# Patient Record
Sex: Male | Born: 1940 | Race: White | Hispanic: No | Marital: Married | Smoking: Former smoker
Health system: Southern US, Community
[De-identification: ages and names within clinical notes are randomized; demographics above are authoritative.]

## PROBLEM LIST (undated history)

## (undated) DIAGNOSIS — I493 Ventricular premature depolarization: Secondary | ICD-10-CM

## (undated) DIAGNOSIS — I8290 Acute embolism and thrombosis of unspecified vein: Secondary | ICD-10-CM

## (undated) DIAGNOSIS — I509 Heart failure, unspecified: Secondary | ICD-10-CM

## (undated) DIAGNOSIS — F419 Anxiety disorder, unspecified: Secondary | ICD-10-CM

## (undated) DIAGNOSIS — E785 Hyperlipidemia, unspecified: Secondary | ICD-10-CM

## (undated) HISTORY — DX: Hyperlipidemia, unspecified: E78.5

## (undated) HISTORY — DX: Heart failure, unspecified: I50.9

## (undated) HISTORY — DX: Anxiety disorder, unspecified: F41.9

## (undated) HISTORY — DX: Ventricular premature depolarization: I49.3

## (undated) HISTORY — PX: OTHER SURGICAL HISTORY: SHX169

## (undated) HISTORY — DX: Acute embolism and thrombosis of unspecified vein: I82.90

---

## 1999-11-13 ENCOUNTER — Emergency Department (HOSPITAL_COMMUNITY): Admission: EM | Admit: 1999-11-13 | Discharge: 1999-11-13 | Payer: Self-pay | Admitting: General Surgery

## 2005-07-22 ENCOUNTER — Inpatient Hospital Stay (HOSPITAL_COMMUNITY): Admission: EM | Admit: 2005-07-22 | Discharge: 2005-07-29 | Payer: Self-pay | Admitting: Emergency Medicine

## 2013-08-16 ENCOUNTER — Other Ambulatory Visit: Payer: Self-pay | Admitting: Interventional Cardiology

## 2013-08-16 DIAGNOSIS — Z79899 Other long term (current) drug therapy: Secondary | ICD-10-CM

## 2013-08-16 DIAGNOSIS — E785 Hyperlipidemia, unspecified: Secondary | ICD-10-CM

## 2013-09-15 ENCOUNTER — Encounter (INDEPENDENT_AMBULATORY_CARE_PROVIDER_SITE_OTHER): Payer: Self-pay

## 2013-09-15 ENCOUNTER — Ambulatory Visit (INDEPENDENT_AMBULATORY_CARE_PROVIDER_SITE_OTHER): Payer: Medicare Other | Admitting: Pharmacist

## 2013-09-15 ENCOUNTER — Other Ambulatory Visit (INDEPENDENT_AMBULATORY_CARE_PROVIDER_SITE_OTHER): Payer: Medicare Other

## 2013-09-15 DIAGNOSIS — Z79899 Other long term (current) drug therapy: Secondary | ICD-10-CM

## 2013-09-15 DIAGNOSIS — E785 Hyperlipidemia, unspecified: Secondary | ICD-10-CM

## 2013-09-15 DIAGNOSIS — I749 Embolism and thrombosis of unspecified artery: Secondary | ICD-10-CM

## 2013-09-15 LAB — LIPID PANEL
HDL: 49.6 mg/dL (ref >39.00)
Total CHOL/HDL Ratio: 3
VLDL: 11.2 mg/dL (ref 0.0–40.0)

## 2013-09-15 LAB — ALT: ALT: 19 U/L (ref 0–53)

## 2013-09-18 ENCOUNTER — Ambulatory Visit: Payer: Self-pay | Admitting: Interventional Cardiology

## 2013-09-18 ENCOUNTER — Telehealth: Payer: Self-pay | Admitting: Cardiology

## 2013-09-18 ENCOUNTER — Encounter: Payer: Self-pay | Admitting: Cardiology

## 2013-09-18 DIAGNOSIS — E785 Hyperlipidemia, unspecified: Secondary | ICD-10-CM

## 2013-09-18 NOTE — Telephone Encounter (Signed)
Message copied by Theda Sers on Thu Sep 18, 2013 11:21 AM ------      Message from: SMART, Gaspar Skeeters      Created: Wed Sep 17, 2013  9:54 AM       Excellent lipid panel on simvastatin 10 mg qd. LDL is well controlled.  No change in meds needed at this time.    Recheck lipid panel and hepatic panel in 6 months. ------

## 2013-09-23 ENCOUNTER — Encounter: Payer: Self-pay | Admitting: Interventional Cardiology

## 2013-09-23 ENCOUNTER — Encounter: Payer: Self-pay | Admitting: *Deleted

## 2013-09-23 DIAGNOSIS — F419 Anxiety disorder, unspecified: Secondary | ICD-10-CM | POA: Insufficient documentation

## 2013-09-23 DIAGNOSIS — I8290 Acute embolism and thrombosis of unspecified vein: Secondary | ICD-10-CM | POA: Insufficient documentation

## 2013-09-23 DIAGNOSIS — E785 Hyperlipidemia, unspecified: Secondary | ICD-10-CM | POA: Insufficient documentation

## 2013-09-23 DIAGNOSIS — I509 Heart failure, unspecified: Secondary | ICD-10-CM | POA: Insufficient documentation

## 2013-09-24 ENCOUNTER — Encounter: Payer: Self-pay | Admitting: Interventional Cardiology

## 2013-09-24 ENCOUNTER — Ambulatory Visit (INDEPENDENT_AMBULATORY_CARE_PROVIDER_SITE_OTHER): Payer: Medicare Other | Admitting: Interventional Cardiology

## 2013-09-24 VITALS — BP 88/52 | HR 76 | Ht 67.0 in | Wt 152.8 lb

## 2013-09-24 DIAGNOSIS — I420 Dilated cardiomyopathy: Secondary | ICD-10-CM | POA: Insufficient documentation

## 2013-09-24 DIAGNOSIS — I493 Ventricular premature depolarization: Secondary | ICD-10-CM

## 2013-09-24 DIAGNOSIS — I059 Rheumatic mitral valve disease, unspecified: Secondary | ICD-10-CM | POA: Insufficient documentation

## 2013-09-24 DIAGNOSIS — I4949 Other premature depolarization: Secondary | ICD-10-CM

## 2013-09-24 DIAGNOSIS — I428 Other cardiomyopathies: Secondary | ICD-10-CM

## 2013-09-24 DIAGNOSIS — R0602 Shortness of breath: Secondary | ICD-10-CM

## 2013-09-24 MED ORDER — LISINOPRIL 5 MG PO TABS: ORAL_TABLET | ORAL | Status: DC

## 2013-09-24 NOTE — Progress Notes (Signed)
Patient ID: Charles Shaw, male   DOB: [birth year], 72 y.o.   MRN: [id number]    [address] [place], [place]  [address] Phone: [phone] Fax:  [phone]  Date:  09/24/2013   ID:  Charles Shaw, DOB Jun 11, 1941, MRN [id number]  PCP:  No primary provider on file.      History of Present Illness: Charles Shaw is a 72 y.o. male who has a familial nonischemic cardiomyopathy. He has been on medical therapy. His lifestyle has improved quite a bit. He has stopped smoking and drinking. He has been on coumadin since 2006. No bleeding problems. COumadin is steady.  He walks regularly. He does yard wrok and remains active. He had been more [facility] in June 2014. he had trouble lying flat.  He had an elevated BNP.  He improved with increased Lasix.  He also has chronic nasal congestion.   Cardiomyopathy:  Denies : Chest pain.  Leg edema.  Orthopnea.  Paroxysmal nocturnal dyspnea.  Palpitations.  Syncope.    DOE with walking up stairs.    Wt Readings from Last 3 Encounters:  09/24/13 152 lb 12.8 oz (69.31 kg)     Past Medical History  Diagnosis Date  . Hyperlipidemia   . Anxiety   . Heart failure   . PVC (premature ventricular contraction)   . Thrombus     Current Outpatient Prescriptions  Medication Sig Dispense Refill  . carvedilol (COREG) 25 MG tablet Take 25 mg by mouth 2 (two) times daily with a meal.      . furosemide (LASIX) 40 MG tablet Take 40 mg by mouth 2 (two) times daily.      Marland Kitchen lisinopril (PRINIVIL,ZESTRIL) 5 MG tablet Take 5 mg by mouth daily.      . potassium chloride (KLOR-CON) 20 MEQ packet Take 20 mEq by mouth once.      . simvastatin (ZOCOR) 10 MG tablet Take 10 mg by mouth daily.      Marland Kitchen warfarin (COUMADIN) 5 MG tablet Take 5 mg by mouth daily. 1/2 TABLET Sunday,Tuesday, Wednesday,Thursday-----1/4 Tablet Monday and Friday       No current facility-administered medications for this visit.    Allergies:   No Known Allergies  Social History:  The  patient  reports that he has quit smoking. He does not have any smokeless tobacco history on file. He reports that he does not drink alcohol or use illicit drugs.   Family History:  The patient's family history includes Heart failure in his brother; Stroke in his mother.   ROS:  Please see the history of present illness.  No nausea, vomiting.  No fevers, chills.  No focal weakness.  No dysuria. Fluid overload better.  Taking extra furosemide occasionally, 1x/week.   All other systems reviewed and negative.   PHYSICAL EXAM: VS:  BP 88/52  Pulse 76  Ht 5\' 7"  (1.702 m)  Wt 152 lb 12.8 oz (69.31 kg)  BMI 23.93 kg/m2 Well nourished, well developed, in no acute distress HEENT: normal Neck: no JVD, no carotid bruits Cardiac:  normal S1, S2; RRR; premature beats Lungs:  clear to auscultation bilaterally, no wheezing, rhonchi or rales Abd: soft, nontender, no hepatomegaly Ext: no edema Skin: warm and dry Neuro:   no focal abnormalities noted     ASSESSMENT AND PLAN:  Treatment  1. SOB  LAB: BNP Notes: I think there is likely a component of allergies/respiratory issues. He has some nasal congestion and phlegm.  He does  have DOE with going stairs.   BNP was  A few months ago. Continue furosemide to 80 mg daily . Can take extra 40 mg based on weight and SHOB. . No lower extremity edema.    2. PVC  IMAGING: EKG    Harward,Amy 04/22/2013 01:55:13 PM > Yasiel Goyne,JAY 04/22/2013 02:01:16 PM > NSR, frequent PVCs.  22% PVCs on Holter in 2013.    Notes: PVCs noted in the past. This may account for the irregularity of his pulse. Certainly, if he has flipped into atrial fibrillation, this could account for some shortness of breath. Bigeminy today.     3. Chronic systolic heart failure  Notes: EF below 30% in the past.  Declined AICD in the past.  Follow weights at home.  Would recommend getting a scale.  Decrease lisinopril to 2.5 mg daily due to low BP.   4. Anticoagulant monitoring  Notes: INR to  be checked and Coumadin dose to be adjusted with Pharm.D.    5. Severe MR:  Worse in 2014 compared to 2011 which likely explains the fluid overload.  I do not think the MR is the cause of his cardiomyopathy.  I think the cardiomyopathy and lv dilatation is the cause of his MR.    Procedures  Venipuncture:  Venipuncture: Hall,Jechelle 04/22/2013 02:08:29 PM > , performed in right arm.        Labs    Lab: BNP  B-NATRIURETIC PEPTIDE in 6/14 1450 H 0-100 - pg/mL     Signed, Fredric Mare, MD, [facility] 09/24/2013 10:49 AM

## 2013-09-24 NOTE — Patient Instructions (Signed)
Your physician has recommended you make the following change in your medication:   1. Decrease Lisinopril to 2.5 mg daily.   2. Continue all other medications.   Your physician wants you to follow-up in: 6 months with Dr. Eldridge Dace. You will receive a reminder letter in the mail two months in advance. If you don't receive a letter, please call our office to schedule the follow-up appointment.

## 2013-10-27 ENCOUNTER — Ambulatory Visit (INDEPENDENT_AMBULATORY_CARE_PROVIDER_SITE_OTHER): Payer: Medicare Other | Admitting: Pharmacist

## 2013-10-27 ENCOUNTER — Encounter (INDEPENDENT_AMBULATORY_CARE_PROVIDER_SITE_OTHER): Payer: Self-pay

## 2013-10-27 DIAGNOSIS — I749 Embolism and thrombosis of unspecified artery: Secondary | ICD-10-CM

## 2013-11-10 ENCOUNTER — Other Ambulatory Visit: Payer: Self-pay

## 2013-11-10 ENCOUNTER — Other Ambulatory Visit: Payer: Self-pay | Admitting: Interventional Cardiology

## 2013-11-10 MED ORDER — LISINOPRIL 5 MG PO TABS: ORAL_TABLET | ORAL | Status: DC

## 2013-11-11 ENCOUNTER — Other Ambulatory Visit: Payer: Self-pay | Admitting: *Deleted

## 2013-11-11 ENCOUNTER — Other Ambulatory Visit: Payer: Self-pay | Admitting: Interventional Cardiology

## 2013-11-11 MED ORDER — LISINOPRIL 2.5 MG PO TABS: 2.5000 mg | ORAL_TABLET | Freq: Every day | ORAL | Status: DC

## 2013-11-29 ENCOUNTER — Telehealth: Payer: Self-pay | Admitting: Physician Assistant

## 2013-11-29 DIAGNOSIS — Z79899 Other long term (current) drug therapy: Secondary | ICD-10-CM

## 2013-11-29 NOTE — Telephone Encounter (Signed)
Patient's wife called the answering service. He has developed increased LE edema. She reports he developed a "cold" about 1 week ago. This is described as congestion and cough, no fevers or chills. He also has severe MR. He has inconsistently adhered to a Na/fluid restriction. He had chicken noodle soup the other day. She is unsure of his weight today. 152 lbs on follow-up w/ Dr. Eldridge Dace 09/2013. She notes increased orthopnea and DOE. He has increased his Lasix to 40mg  TID with minimal relief. Advised to increase to 80mg  BID for the weekend, increase KCl correspondingly, take on an empty stomach, elevate legs, continue to minimize salt/fluid intake. Monitor over weekend. If continues to worsen, advised to present to the nearest ED. Otherwise, will call the office to arrange an appointment for Monday.   Jacqulyn Bath, PA-C 11/29/2013 4:39 PM

## 2013-12-02 NOTE — Telephone Encounter (Signed)
How is [facility]

## 2013-12-03 NOTE — Telephone Encounter (Signed)
Spoke with pts wife and pt is feeling much better. Pt was taking furosemide with meals and he shouldn't have been doing that. He now takes lasix on an empty stomach and he feels much better. Pt is taking lasix 40mg  4 tablets daily. Pt is taking k-dur 20 meq 2 tabs daily. Is this enough? Pts wife also wants to know if pt needs magnesium? Pt still has a cough/cold, but it is better.

## 2013-12-03 NOTE — Telephone Encounter (Addendum)
Also, pt sleeps until noon everyday. Pt has a lot of fatigue and is grouchy at times.

## 2013-12-04 NOTE — Telephone Encounter (Signed)
To Dr. Varanasi, please advise.  

## 2013-12-04 NOTE — Addendum Note (Signed)
Addended byOrlene Plum H on: 12/04/2013 02:22 PM   Modules accepted: Orders

## 2013-12-04 NOTE — Telephone Encounter (Addendum)
Pts wife notified. Pt will have Bmet and Mag level checked on 12/08/13 as he will be here for coumadin. He started the increase in lasix om 1.10.15, so he will have been on below dosage for over a week by the time of lab test. Pt has a lot of fatigue, but has has a cold since October 14. I told pts wife this needs to be addressed by his pcp Dr. Sigmund Hazel asap as he should not have prolonged cold symptoms for over 3 months. Pt can not sleep more than an 1.5 hr at a time (takes a lot of naps throughout the day as well). He just cant sleep well. No witnessed apnea. Pt does snore almost every night and when he sleeps on his side the snoring is not as bad, but when on his back it is worse.

## 2013-12-04 NOTE — Telephone Encounter (Signed)
Can ask about sx of sleep apnea.  Can check BMet with Magnesium in one week.

## 2014-01-02 ENCOUNTER — Ambulatory Visit (INDEPENDENT_AMBULATORY_CARE_PROVIDER_SITE_OTHER): Payer: Medicare Other | Admitting: Pharmacist

## 2014-01-02 ENCOUNTER — Telehealth: Payer: Self-pay | Admitting: Cardiology

## 2014-01-02 DIAGNOSIS — I749 Embolism and thrombosis of unspecified artery: Secondary | ICD-10-CM

## 2014-01-02 LAB — POCT INR: INR: 4.3

## 2014-01-02 MED ORDER — WARFARIN SODIUM 2.5 MG PO TABS: ORAL_TABLET | ORAL | Status: DC

## 2014-01-02 NOTE — Telephone Encounter (Signed)
At last OV pts lisinopril was decreased to 2.5 mg. Pt states he feels the same (still tired) and would like to go back to 5 mg daily.

## 2014-01-05 MED ORDER — LISINOPRIL 5 MG PO TABS: ORAL_TABLET | ORAL | Status: DC

## 2014-01-05 NOTE — Telephone Encounter (Signed)
Pts wife notified. Pt has been back on 5 mg for quite sometime and he feels better on the 5 mg.

## 2014-01-05 NOTE — Telephone Encounter (Signed)
Ok to go back to 5 mg if BP is not dropping too low.

## 2014-01-05 NOTE — Addendum Note (Signed)
Addended byOrlene Plum H on: 01/05/2014 10:47 AM   Modules accepted: Orders

## 2014-01-16 ENCOUNTER — Ambulatory Visit (INDEPENDENT_AMBULATORY_CARE_PROVIDER_SITE_OTHER): Payer: Medicare Other | Admitting: *Deleted

## 2014-01-16 DIAGNOSIS — I749 Embolism and thrombosis of unspecified artery: Secondary | ICD-10-CM

## 2014-01-16 LAB — POCT INR: INR: 2.4

## 2014-01-22 ENCOUNTER — Other Ambulatory Visit: Payer: Self-pay | Admitting: Interventional Cardiology

## 2014-02-13 ENCOUNTER — Ambulatory Visit (INDEPENDENT_AMBULATORY_CARE_PROVIDER_SITE_OTHER): Payer: Medicare Other

## 2014-02-13 DIAGNOSIS — I749 Embolism and thrombosis of unspecified artery: Secondary | ICD-10-CM

## 2014-02-13 LAB — POCT INR: INR: 2.5

## 2014-02-14 ENCOUNTER — Other Ambulatory Visit: Payer: Self-pay | Admitting: Interventional Cardiology

## 2014-03-13 ENCOUNTER — Ambulatory Visit (INDEPENDENT_AMBULATORY_CARE_PROVIDER_SITE_OTHER): Payer: Medicare Other | Admitting: Pharmacist

## 2014-03-13 DIAGNOSIS — I749 Embolism and thrombosis of unspecified artery: Secondary | ICD-10-CM

## 2014-03-13 LAB — POCT INR: INR: 3.7

## 2014-03-19 ENCOUNTER — Encounter: Payer: Self-pay | Admitting: Interventional Cardiology

## 2014-03-19 ENCOUNTER — Ambulatory Visit (INDEPENDENT_AMBULATORY_CARE_PROVIDER_SITE_OTHER): Payer: Medicare Other

## 2014-03-19 ENCOUNTER — Ambulatory Visit (INDEPENDENT_AMBULATORY_CARE_PROVIDER_SITE_OTHER): Payer: Medicare Other | Admitting: Interventional Cardiology

## 2014-03-19 ENCOUNTER — Other Ambulatory Visit (INDEPENDENT_AMBULATORY_CARE_PROVIDER_SITE_OTHER): Payer: Medicare Other

## 2014-03-19 VITALS — BP 90/40 | HR 66 | Ht 67.0 in | Wt 142.0 lb

## 2014-03-19 DIAGNOSIS — I749 Embolism and thrombosis of unspecified artery: Secondary | ICD-10-CM

## 2014-03-19 DIAGNOSIS — I059 Rheumatic mitral valve disease, unspecified: Secondary | ICD-10-CM

## 2014-03-19 DIAGNOSIS — Z79899 Other long term (current) drug therapy: Secondary | ICD-10-CM

## 2014-03-19 DIAGNOSIS — E785 Hyperlipidemia, unspecified: Secondary | ICD-10-CM

## 2014-03-19 DIAGNOSIS — I428 Other cardiomyopathies: Secondary | ICD-10-CM

## 2014-03-19 LAB — LIPID PANEL
CHOLESTEROL: 140 mg/dL (ref 0–200)
HDL: 41.4 mg/dL (ref >39.00)
LDL Cholesterol: 86 mg/dL (ref 0–99)
TRIGLYCERIDES: 64 mg/dL (ref 0.0–149.0)
Total CHOL/HDL Ratio: 3
VLDL: 12.8 mg/dL (ref 0.0–40.0)

## 2014-03-19 LAB — BASIC METABOLIC PANEL
BUN: 20 mg/dL (ref 6–23)
CALCIUM: 9.3 mg/dL (ref 8.4–10.5)
CO2: 27 mEq/L (ref 19–32)
Chloride: 94 mEq/L — ABNORMAL LOW (ref 96–112)
Creatinine, Ser: 0.8 mg/dL (ref 0.4–1.5)
GFR: 95.87 mL/min (ref >60.00)
GLUCOSE: 88 mg/dL (ref 70–99)
Potassium: 4.8 mEq/L (ref 3.5–5.1)
Sodium: 130 mEq/L — ABNORMAL LOW (ref 135–145)

## 2014-03-19 LAB — HEPATIC FUNCTION PANEL
ALT: 33 U/L (ref 0–53)
AST: 31 U/L (ref 0–37)
Albumin: 3.6 g/dL (ref 3.5–5.2)
Alkaline Phosphatase: 175 U/L — ABNORMAL HIGH (ref 39–117)
Bilirubin, Direct: 1.1 mg/dL — ABNORMAL HIGH (ref 0.0–0.3)
TOTAL PROTEIN: 7.4 g/dL (ref 6.0–8.3)
Total Bilirubin: 3 mg/dL — ABNORMAL HIGH (ref 0.3–1.2)

## 2014-03-19 LAB — POCT INR: INR: 3.3

## 2014-03-19 NOTE — Patient Instructions (Signed)
Your physician recommends that you return for a FASTING lipid profile, hepatic and bmet.  Your physician recommends that you continue on your current medications as directed. Please refer to the Current Medication list given to you today.  Your physician wants you to follow-up in: 6 months with Dr. Eldridge Dace. You will receive a reminder letter in the mail two months in advance. If you don't receive a letter, please call our office to schedule the follow-up appointment.

## 2014-03-19 NOTE — Progress Notes (Signed)
Patient ID: Charles Shaw, male   DOB: May 07, 1941, 73 y.o.   MRN: [id number] Patient ID: Charles Shaw, male   DOB: May 07, 1941, [age] y.o.   MRN: [id number]    [address] [place], [place]  [address] Phone: [phone] Fax:  [phone]) [phone]  Date:  03/19/2014   ID:  Charles Shaw, DOB May 07, 1941, MRN [id number]  PCP:  Default, Provider, MD      History of Present Illness: Charles Shaw is a 73 y.o. male who has a familial nonischemic cardiomyopathy. He has been on medical therapy. His lifestyle has improved quite a bit. He has stopped smoking and drinking. He has been on coumadin since 2006. No bleeding problems. COumadin is steady.  He is not walking regularly. He does yard wrok and remains active. [facility]. he had trouble lying flat.  He had an elevated BNP.  He improved with increased Lasix.  He has more effect from the diuretic when taken on an empty stomach.   He had some swelling and called in on a weekend a few months ago, he had relief with additional diuretics.     Cardiomyopathy:  Denies : Chest pain.  Leg edema.  Orthopnea.  Paroxysmal nocturnal dyspnea.  Palpitations.  Syncope.    DOE with walking up stairs.    Wt Readings from Last 3 Encounters:  03/19/14 142 lb (64.411 kg)  09/24/13 152 lb 12.8 oz (69.31 kg)     Past Medical History  Diagnosis Date  . Hyperlipidemia   . Anxiety   . Heart failure   . PVC (premature ventricular contraction)   . Thrombus     Current Outpatient Prescriptions  Medication Sig Dispense Refill  . carvedilol (COREG) 25 MG tablet TAKE 1 TABLET WITH FOOD TWICE A DAY ORALLY (NEW RX AS OF 12/01/11)  180 tablet  2  . furosemide (LASIX) 40 MG tablet TAKE ONE TABLET BY MOUTH TWICE DAILY - MAY TAKE AN EXTRA TABLET DAILYIF NEEDED AS DIRECTED  200 tablet  0  . lisinopril (PRINIVIL,ZESTRIL) 5 MG tablet 1 tablet po Daily.  90 tablet  1  . potassium chloride (KLOR-CON) 20 MEQ packet Take 20 mEq by mouth once.      .  simvastatin (ZOCOR) 10 MG tablet TAKE ONE TABLET BY MOUTH DAILY IN THE EVENING  90 tablet  0  . warfarin (COUMADIN) 2.5 MG tablet Take 1 tablet as directed by coumadin clinic  30 tablet  3   No current facility-administered medications for this visit.    Allergies:   No Known Allergies  Social History:  The patient  reports that he has quit smoking. He does not have any smokeless tobacco history on file. He reports that he does not drink alcohol or use illicit drugs.   Family History:  The patient's family history includes Heart failure in his brother; Stroke in his mother.   ROS:  Please see the history of present illness.  No nausea, vomiting.  No fevers, chills.  No focal weakness.  No dysuria. Fluid overload better.  Taking extra furosemide occasionally, 1x/week.   All other systems reviewed and negative.   PHYSICAL EXAM: VS:  BP 90/40  Pulse 66  Ht 5\' 7"  (1.702 m)  Wt 142 lb (64.411 kg)  BMI 22.24 kg/m2 Well nourished, well developed, in no acute distress; appears euvolemic HEENT: normal Neck: no JVD, no carotid bruits Cardiac:  normal S1, S2; RRR; premature beats Lungs:  clear to  auscultation bilaterally, no wheezing, rhonchi or rales Abd: soft, nontender, no hepatomegaly Ext: no edema Skin: warm and dry Neuro:   no focal abnormalities noted     ASSESSMENT AND PLAN:  Treatment  1. SOB  LAB: BNP Notes: I think there is likely a component of allergies/respiratory issues. He has some nasal congestion and phlegm.  He does have DOE with going stairs.   BNP was elevated  A few months ago. Continue furosemide to 60 mg daily . Can take extra 40 mg based on weight and SHOB. . No lower extremity edema.  Sx are much better at this time.    2. PVC  IMAGING: EKG    Harward,Amy 04/22/2013 01:55:13 PM > Luc Shammas,JAY 04/22/2013 02:01:16 PM > NSR, frequent PVCs.  22% PVCs on Holter in 2013.    Notes: PVCs noted in the past. This may account for the irregularity of his pulse.  Certainly, if he has flipped into atrial fibrillation, this could account for some shortness of breath. Bigeminy today.     3. Chronic systolic heart failure  Notes: EF below 30% in the past.  Declined AICD in the past.  Follow weights at home.  Would recommend getting a scale.  Decrease lisinopril to 2.5 mg daily due to low BP.   4. Anticoagulant monitoring  Notes: INR to be checked and Coumadin dose to be adjusted with Pharm.D.    5. Severe MR:  Worse in 2014 compared to 2011 which likely explained the fluid overload in 2014.  I do not think the MR is the cause of his cardiomyopathy.  I think the cardiomyopathy and lv dilatation is the cause of his MR.   Still not interested with invasive management.  Manage with diuretics.   Lipids reviewed.  Well controlled.  COntinue simvastatin.          Lab: BNP  B-NATRIURETIC PEPTIDE in 6/14 1450 H 0-100 - pg/mL     Signed, Fredric Mare, MD, [facility] 03/19/2014 8:50 AM

## 2014-03-23 ENCOUNTER — Encounter: Payer: Self-pay | Admitting: Cardiology

## 2014-03-23 ENCOUNTER — Telehealth: Payer: Self-pay | Admitting: Cardiology

## 2014-03-23 DIAGNOSIS — Z79899 Other long term (current) drug therapy: Secondary | ICD-10-CM

## 2014-03-23 NOTE — Telephone Encounter (Signed)
Message copied by Alcario Drought on Mon Mar 23, 2014  2:30 PM ------      Message from: Jettie Booze      Created: Thu Mar 19, 2014  5:56 PM       Mild hyponatremia. Recheck be met in 2 weeks. OK to continue same dose of Lasix. ------

## 2014-03-23 NOTE — Telephone Encounter (Signed)
Pts wife notified. Copy mailed. Lab ordered.

## 2014-03-26 ENCOUNTER — Encounter: Payer: Self-pay | Admitting: Cardiology

## 2014-03-30 ENCOUNTER — Telehealth: Payer: Self-pay | Admitting: Interventional Cardiology

## 2014-03-30 DIAGNOSIS — R17 Unspecified jaundice: Secondary | ICD-10-CM

## 2014-03-30 DIAGNOSIS — R748 Abnormal levels of other serum enzymes: Secondary | ICD-10-CM

## 2014-03-30 DIAGNOSIS — Z79899 Other long term (current) drug therapy: Secondary | ICD-10-CM

## 2014-03-30 NOTE — Telephone Encounter (Signed)
New Message  Pt wife called states that a nurse called to have an appt scheduled with the gastrologist. Pt wife requests to have the liver panel retested because she doesn't see any yellow in the pt's skin or eyes. Pt wife states that he was a little under the weather during the last office visit and inquires if this is the reason why the test came back negative. Please call

## 2014-03-30 NOTE — Telephone Encounter (Signed)
Spoke with pts wife and pt wonders if his elevation in lever function could be from him from feeling well last week and from his pt/inr levels being off in the past. Pt feels his levels are up to much now and he thinks it may need to be decreased again. His next check 04/09/14. Pt wants to know if he should have another hepatic panel repeated before we refer him to GI or are the levels elevated enough where he needs to go ahead and be referred.

## 2014-03-31 NOTE — Telephone Encounter (Signed)
Pt will come for hepatic panel on 04/09/14 and then after we get results we can make final decision on referral. FYI to Dr. Eldridge Dace.

## 2014-03-31 NOTE — Telephone Encounter (Signed)
Okay to recheck hepatic panel.  Check hepatic panel sometime in next 1-2 weeks.  Thanks.

## 2014-04-02 ENCOUNTER — Other Ambulatory Visit: Payer: Medicare Other

## 2014-04-06 NOTE — Telephone Encounter (Signed)
Follow Up:  PT canceled labs for 5/21 but the pt would like to be referred to a GI doctor.

## 2014-04-07 ENCOUNTER — Encounter: Payer: Self-pay | Admitting: Internal Medicine

## 2014-04-07 NOTE — Telephone Encounter (Signed)
Pt aware that referral has been sent. [facility] will call to schedule.

## 2014-04-08 ENCOUNTER — Telehealth: Payer: Self-pay | Admitting: Interventional Cardiology

## 2014-04-08 NOTE — Telephone Encounter (Signed)
New message ° ° ° ° ° °Talk to a nurse----she would not tell me what she wanted °

## 2014-04-08 NOTE — Telephone Encounter (Signed)
Called pt.  States he only wants to speak with Amy.  Would not tell what the call was regarding.  Advised Amy was not in office today.  He just said for her to call when she was back in office.

## 2014-04-09 ENCOUNTER — Ambulatory Visit (INDEPENDENT_AMBULATORY_CARE_PROVIDER_SITE_OTHER): Payer: Medicare Other | Admitting: Pharmacist Clinician (PhC)/ Clinical Pharmacy Specialist

## 2014-04-09 ENCOUNTER — Other Ambulatory Visit: Payer: Medicare Other

## 2014-04-09 DIAGNOSIS — I749 Embolism and thrombosis of unspecified artery: Secondary | ICD-10-CM

## 2014-04-09 LAB — POCT INR: INR: 4.3

## 2014-04-09 NOTE — Telephone Encounter (Signed)
Called pt and spoke with his wife while he was beside her. He just wanted to see if we could get him seen sooner with the GI doctor. I will work on getting pts appt moved up.

## 2014-04-10 NOTE — Telephone Encounter (Signed)
Appt with GI moved up to 04/14/14 at 10am. Pts wife notified.

## 2014-04-14 ENCOUNTER — Ambulatory Visit (INDEPENDENT_AMBULATORY_CARE_PROVIDER_SITE_OTHER): Payer: Medicare Other | Admitting: Gastroenterology

## 2014-04-14 ENCOUNTER — Encounter: Payer: Self-pay | Admitting: Gastroenterology

## 2014-04-14 ENCOUNTER — Other Ambulatory Visit (INDEPENDENT_AMBULATORY_CARE_PROVIDER_SITE_OTHER): Payer: Medicare Other

## 2014-04-14 VITALS — BP 100/60 | HR 70 | Ht 67.0 in | Wt 147.0 lb

## 2014-04-14 DIAGNOSIS — R945 Abnormal results of liver function studies: Secondary | ICD-10-CM

## 2014-04-14 DIAGNOSIS — K4090 Unilateral inguinal hernia, without obstruction or gangrene, not specified as recurrent: Secondary | ICD-10-CM

## 2014-04-14 LAB — CBC WITH DIFFERENTIAL/PLATELET
BASOS PCT: 0.8 % (ref 0.0–3.0)
Basophils Absolute: 0 10*3/uL (ref 0.0–0.1)
EOS ABS: 0.3 10*3/uL (ref 0.0–0.7)
Eosinophils Relative: 5 % (ref 0.0–5.0)
HCT: 39.3 % (ref 39.0–52.0)
Hemoglobin: 13.1 g/dL (ref 13.0–17.0)
Lymphocytes Relative: 12.4 % (ref 12.0–46.0)
Lymphs Abs: 0.7 10*3/uL (ref 0.7–4.0)
MCHC: 33.3 g/dL (ref 30.0–36.0)
MCV: 95.5 fl (ref 78.0–100.0)
MONO ABS: 0.6 10*3/uL (ref 0.1–1.0)
Monocytes Relative: 10.2 % (ref 3.0–12.0)
NEUTROS PCT: 71.6 % (ref 43.0–77.0)
Neutro Abs: 4.3 10*3/uL (ref 1.4–7.7)
Platelets: 200 10*3/uL (ref 150.0–400.0)
RBC: 4.11 Mil/uL — AB (ref 4.22–5.81)
RDW: 16.7 % — AB (ref 11.5–15.5)
WBC: 6 10*3/uL (ref 4.0–10.5)

## 2014-04-14 LAB — BASIC METABOLIC PANEL
BUN: 22 mg/dL (ref 6–23)
CHLORIDE: 99 meq/L (ref 96–112)
CO2: 27 mEq/L (ref 19–32)
CREATININE: 1 mg/dL (ref 0.4–1.5)
Calcium: 9.3 mg/dL (ref 8.4–10.5)
GFR: 81.18 mL/min (ref >60.00)
Glucose, Bld: 86 mg/dL (ref 70–99)
POTASSIUM: 4.2 meq/L (ref 3.5–5.1)
SODIUM: 135 meq/L (ref 135–145)

## 2014-04-14 LAB — HEPATIC FUNCTION PANEL
ALK PHOS: 107 U/L (ref 39–117)
ALT: 26 U/L (ref 0–53)
AST: 26 U/L (ref 0–37)
Albumin: 3.7 g/dL (ref 3.5–5.2)
BILIRUBIN DIRECT: 0.8 mg/dL — AB (ref 0.0–0.3)
BILIRUBIN TOTAL: 2.4 mg/dL — AB (ref 0.2–1.2)
Total Protein: 6.8 g/dL (ref 6.0–8.3)

## 2014-04-14 LAB — TSH: TSH: 1.08 u[IU]/mL (ref 0.35–4.50)

## 2014-04-14 NOTE — Patient Instructions (Signed)
Your physician has requested that you go to the basement for the following lab work before leaving today: [facility]  You have been scheduled for a CT scan of the abdomen and pelvis at [place] [place] ([address]---this is in the same building as [facility]).   You are scheduled on 04/16/14 at 10:00am. You should arrive 15 minutes prior to your appointment time for registration. Please follow the written instructions below on the day of your exam:  WARNING: IF YOU ARE ALLERGIC TO IODINE/X-RAY DYE, PLEASE NOTIFY RADIOLOGY IMMEDIATELY AT [phone]! YOU WILL BE GIVEN A 13 HOUR PREMEDICATION PREP.  1) Do not eat or drink anything after 6:00am (4 hours prior to your test) 2) You have been given 2 bottles of oral contrast to drink. The solution may taste better if refrigerated, but do NOT add ice or any other liquid to this solution. Shake well before drinking.    Drink 1 bottle of contrast @ 8:00am (2 hours prior to your exam)  Drink 1 bottle of contrast @ 9:00am (1 hour prior to your exam)  You may take any medications as prescribed with a small amount of water except for the following: Metformin, Glucophage, Glucovance, Avandamet, Riomet, Fortamet, Actoplus Met, Janumet, Glumetza or Metaglip. The above medications must be held the day of the exam AND 48 hours after the exam.  The purpose of you drinking the oral contrast is to aid in the visualization of your intestinal tract. The contrast solution may cause some diarrhea. Before your exam is started, you will be given a small amount of fluid to drink. Depending on your individual set of symptoms, you may also receive an intravenous injection of x-ray contrast/dye. Plan on being at [facility] for 30 minutes or long, depending on the type of exam you are having performed.  This test typically takes 30-45 minutes to complete.  If you have any questions regarding your exam or if you need to reschedule, you may  call the CT department at [phone] between the hours of 8:00 am and 5:00 pm, Monday-Friday.  ________________________________________________________________________  Thank you for choosing me and [facility].  Pricilla Riffle. Dagoberto Ligas., MD., [facility]

## 2014-04-14 NOTE — Progress Notes (Signed)
    History of Present Illness: This is a 73 year old no accompanied by his wife. He is followed by Dr. Eldridge Dace for nonischemic cardiomyopathy with an ejection fraction below 30% in the past. He is maintained on warfarin anticoagulation. He does not have a regular primary care physician. Found to have an elevated total bilirubin at 3.0, direct bilirubin elevated at 1.1 and alkaline phosphatase elevated at 175. The rest of his LFTs were normal. He denies any personal or family history of liver disease. No new medications. He relates a slight change in taste and a slight decrease in appetite over the past few weeks. He has a chronic right inguinal hernia that he has treated with a truss. No prior colonoscopy or EGD. Denies weight loss, abdominal pain, constipation, diarrhea, change in stool caliber, melena, hematochezia, nausea, vomiting, dysphagia, reflux symptoms, chest pain.   Review of Systems: Pertinent positive and negative review of systems were noted in the above HPI section. All other review of systems were otherwise negative.  Current Medications, Allergies, Past Medical History, Past Surgical History, Family History and Social History were reviewed in [employer] record.  Physical Exam: General: Well developed , well nourished, no acute distress Head: Normocephalic and atraumatic Eyes:  sclerae anicteric, EOMI Ears: Normal auditory acuity Mouth: No deformity or lesions Neck: Supple, no masses or thyromegaly Lungs: Clear throughout to auscultation Heart: Regular rate and rhythm; no murmurs, rubs or bruits Abdomen: Soft, non tender and non distended. No masses noted. The liver edge is palpable 2 fingerbreadths below the right costal margin. Right inguinal hernia, reducible. Normal Bowel sounds Musculoskeletal: Symmetrical with no gross deformities  Skin: No lesions on visible extremities Pulses:  Normal pulses noted Extremities: No clubbing, cyanosis, edema or  deformities noted Neurological: Alert oriented x 4, grossly nonfocal Cervical Nodes:  No significant cervical adenopathy Inguinal Nodes: No significant inguinal adenopathy Psychological:  Alert and cooperative. Normal mood and affect  Assessment and Recommendations:  1. Elevated total bilirubin and alkaline phosphatase. Rule out hepatic congestion related to cardiomyopathy. Rule out biliary obstruction and intrinsic liver disease. Repeat LFTs and obtain CBC, chemistries and TSH today. Schedule abdominal/pelvic CT. Further evaluation pending these results.  2. Right inguinal hernia. Further evaluation with abdominal/ pelvic CT. Consider surgical referral.  3. Cardiomyopathy with EF below 30% the past.  4. Chronic warfarin anticoagulation.

## 2014-04-15 ENCOUNTER — Telehealth: Payer: Self-pay | Admitting: Gastroenterology

## 2014-04-15 NOTE — Telephone Encounter (Signed)
Patient asking that if LFTs were improved was it necessary to proceed with CT scan tomorrow. I have explained that he should keep the appt for CT as requested by Dr. Russella Dar

## 2014-04-16 ENCOUNTER — Other Ambulatory Visit: Payer: Self-pay

## 2014-04-16 ENCOUNTER — Ambulatory Visit (INDEPENDENT_AMBULATORY_CARE_PROVIDER_SITE_OTHER)
Admission: RE | Admit: 2014-04-16 | Discharge: 2014-04-16 | Disposition: A | Discharge status: Home / Self Care | Payer: Medicare Other | Source: Ambulatory Visit | Attending: Gastroenterology | Admitting: Gastroenterology

## 2014-04-16 DIAGNOSIS — K4090 Unilateral inguinal hernia, without obstruction or gangrene, not specified as recurrent: Secondary | ICD-10-CM

## 2014-04-16 DIAGNOSIS — R945 Abnormal results of liver function studies: Secondary | ICD-10-CM

## 2014-04-16 MED ORDER — IOHEXOL 300 MG/ML  SOLN
100.0000 mL | Freq: Once | INTRAMUSCULAR | Status: AC | PRN
  Administered 2014-04-16: 100 mL via INTRAVENOUS

## 2014-04-20 ENCOUNTER — Ambulatory Visit (INDEPENDENT_AMBULATORY_CARE_PROVIDER_SITE_OTHER): Payer: Medicare Other | Admitting: Pharmacist

## 2014-04-20 DIAGNOSIS — I749 Embolism and thrombosis of unspecified artery: Secondary | ICD-10-CM

## 2014-04-20 LAB — POCT INR: INR: 2.3

## 2014-04-21 ENCOUNTER — Other Ambulatory Visit: Payer: Self-pay | Admitting: Interventional Cardiology

## 2014-04-22 NOTE — Telephone Encounter (Signed)
Should this be one or two daily? Please advise. Thanks, MI 

## 2014-04-24 ENCOUNTER — Other Ambulatory Visit: Payer: Self-pay | Admitting: Interventional Cardiology

## 2014-05-08 ENCOUNTER — Other Ambulatory Visit: Payer: Self-pay | Admitting: Interventional Cardiology

## 2014-05-18 ENCOUNTER — Ambulatory Visit (INDEPENDENT_AMBULATORY_CARE_PROVIDER_SITE_OTHER): Payer: Medicare Other | Admitting: Pharmacist

## 2014-05-18 DIAGNOSIS — I749 Embolism and thrombosis of unspecified artery: Secondary | ICD-10-CM

## 2014-05-18 LAB — POCT INR: INR: 2.6

## 2014-06-08 ENCOUNTER — Ambulatory Visit: Payer: Medicare Other | Admitting: Internal Medicine

## 2014-06-15 ENCOUNTER — Other Ambulatory Visit: Payer: Self-pay | Admitting: Interventional Cardiology

## 2014-06-15 ENCOUNTER — Ambulatory Visit (INDEPENDENT_AMBULATORY_CARE_PROVIDER_SITE_OTHER): Payer: Medicare Other | Admitting: Pharmacist

## 2014-06-15 DIAGNOSIS — I749 Embolism and thrombosis of unspecified artery: Secondary | ICD-10-CM

## 2014-06-15 LAB — POCT INR: INR: 2.5

## 2014-06-15 MED ORDER — FUROSEMIDE 40 MG PO TABS: ORAL_TABLET | ORAL | Status: DC

## 2014-07-03 ENCOUNTER — Other Ambulatory Visit: Payer: Self-pay | Admitting: Interventional Cardiology

## 2014-07-21 ENCOUNTER — Other Ambulatory Visit: Payer: Self-pay | Admitting: Interventional Cardiology

## 2014-07-31 ENCOUNTER — Ambulatory Visit (INDEPENDENT_AMBULATORY_CARE_PROVIDER_SITE_OTHER): Payer: Medicare Other | Admitting: Pharmacist

## 2014-07-31 DIAGNOSIS — I749 Embolism and thrombosis of unspecified artery: Secondary | ICD-10-CM

## 2014-07-31 LAB — POCT INR: INR: 3.3

## 2014-08-28 ENCOUNTER — Ambulatory Visit (INDEPENDENT_AMBULATORY_CARE_PROVIDER_SITE_OTHER): Payer: Medicare Other | Admitting: *Deleted

## 2014-08-28 DIAGNOSIS — I749 Embolism and thrombosis of unspecified artery: Secondary | ICD-10-CM

## 2014-08-28 LAB — POCT INR: INR: 2

## 2014-09-04 ENCOUNTER — Other Ambulatory Visit: Payer: Self-pay | Admitting: Interventional Cardiology

## 2014-09-28 ENCOUNTER — Other Ambulatory Visit: Payer: Self-pay | Admitting: Interventional Cardiology

## 2014-10-05 ENCOUNTER — Other Ambulatory Visit: Payer: Self-pay | Admitting: Interventional Cardiology

## 2014-10-06 ENCOUNTER — Other Ambulatory Visit: Payer: Self-pay | Admitting: Interventional Cardiology

## 2014-10-07 ENCOUNTER — Ambulatory Visit (INDEPENDENT_AMBULATORY_CARE_PROVIDER_SITE_OTHER): Payer: Medicare Other | Admitting: Pharmacist

## 2014-10-07 DIAGNOSIS — I749 Embolism and thrombosis of unspecified artery: Secondary | ICD-10-CM

## 2014-10-07 LAB — POCT INR: INR: 2.6

## 2014-10-23 ENCOUNTER — Other Ambulatory Visit: Payer: Self-pay | Admitting: Interventional Cardiology

## 2014-10-24 NOTE — Telephone Encounter (Signed)
Rx was sent to pharmacy electronically. OV 11/30/2014

## 2014-11-23 ENCOUNTER — Ambulatory Visit (INDEPENDENT_AMBULATORY_CARE_PROVIDER_SITE_OTHER): Payer: Medicare Other

## 2014-11-23 DIAGNOSIS — I749 Embolism and thrombosis of unspecified artery: Secondary | ICD-10-CM

## 2014-11-23 LAB — POCT INR: INR: 4.1

## 2014-11-30 ENCOUNTER — Other Ambulatory Visit: Payer: Self-pay | Admitting: Interventional Cardiology

## 2014-11-30 ENCOUNTER — Ambulatory Visit (INDEPENDENT_AMBULATORY_CARE_PROVIDER_SITE_OTHER): Payer: Medicare Other | Admitting: Interventional Cardiology

## 2014-11-30 ENCOUNTER — Encounter: Payer: Self-pay | Admitting: Interventional Cardiology

## 2014-11-30 VITALS — BP 90/60 | HR 71 | Ht 67.0 in | Wt 150.0 lb

## 2014-11-30 DIAGNOSIS — I493 Ventricular premature depolarization: Secondary | ICD-10-CM

## 2014-11-30 DIAGNOSIS — E785 Hyperlipidemia, unspecified: Secondary | ICD-10-CM

## 2014-11-30 DIAGNOSIS — I059 Rheumatic mitral valve disease, unspecified: Secondary | ICD-10-CM

## 2014-11-30 DIAGNOSIS — I420 Dilated cardiomyopathy: Secondary | ICD-10-CM

## 2014-11-30 LAB — COMPREHENSIVE METABOLIC PANEL
ALBUMIN: 3.8 g/dL (ref 3.5–5.2)
ALT: 18 U/L (ref 0–53)
AST: 27 U/L (ref 0–37)
Alkaline Phosphatase: 177 U/L — ABNORMAL HIGH (ref 39–117)
BUN: 23 mg/dL (ref 6–23)
CO2: 26 meq/L (ref 19–32)
Calcium: 9.4 mg/dL (ref 8.4–10.5)
Chloride: 99 mEq/L (ref 96–112)
Creatinine, Ser: 1 mg/dL (ref 0.4–1.5)
GFR: 75.61 mL/min (ref >60.00)
Glucose, Bld: 77 mg/dL (ref 70–99)
Potassium: 5.4 mEq/L — ABNORMAL HIGH (ref 3.5–5.1)
SODIUM: 135 meq/L (ref 135–145)
Total Bilirubin: 3.3 mg/dL — ABNORMAL HIGH (ref 0.2–1.2)
Total Protein: 7.1 g/dL (ref 6.0–8.3)

## 2014-11-30 LAB — LIPID PANEL
Cholesterol: 109 mg/dL (ref 0–200)
HDL: 25.5 mg/dL — ABNORMAL LOW (ref >39.00)
LDL Cholesterol: 65 mg/dL (ref 0–99)
NonHDL: 83.5
Total CHOL/HDL Ratio: 4
Triglycerides: 91 mg/dL (ref 0.0–149.0)
VLDL: 18.2 mg/dL (ref 0.0–40.0)

## 2014-11-30 NOTE — Patient Instructions (Signed)
Your physician recommends that you continue on your current medications as directed. Please refer to the Current Medication list given to you today.  Your physician recommends that you return for lab work today (CMET and Lipid Profile)  Your physician wants you to follow-up in: 9 months with Dr. Eldridge Dace. You will receive a reminder letter in the mail two months in advance. If you don't receive a letter, please call our office to schedule the follow-up appointment.

## 2014-11-30 NOTE — Progress Notes (Signed)
Patient ID: Charles Shaw, male   DOB: June 10, 1941, 74 y.o.   MRN: [id number]     [address] [place], [place]  [address] Phone: [phone] Fax:  [phone]  Date:  11/30/2014   ID:  Charles Shaw, DOB [birth year], MRN [id number]  PCP:  Default, Provider, MD      History of Present Illness: Charles Shaw is a 74 y.o. male who has a familial nonischemic cardiomyopathy. He has been on medical therapy. His lifestyle has improved quite a bit. He has stopped smoking and drinking. He has been on coumadin since 2006. No bleeding problems. COumadin is steady.  He is not walking regularly. He does yard wrok and remains active. He had been more [facility] in June 2014. he had trouble lying flat.  He had an elevated BNP.  He improved with increased Lasix.  He has more effect from the diuretic when taken on an empty stomach.   He had some swelling and called in on a weekend a few months ago, he had relief with additional diuretics.     Cardiomyopathy:  Denies : Chest pain.  Leg edema.  Orthopnea.  Paroxysmal nocturnal dyspnea.  Palpitations.  Syncope.    DOE with walking up stairs improved.  He is under stress die to his wife's breast cancer.   This has interfered with his regular schedule and thinks it could be the cause of his irritability.   Follows up at Urgent Care.    Wt Readings from Last 3 Encounters:  11/30/14 150 lb (68.04 kg)  04/14/14 147 lb (66.679 kg)  03/19/14 142 lb (64.411 kg)     Past Medical History  Diagnosis Date  . Hyperlipidemia   . Anxiety   . Heart failure   . PVC (premature ventricular contraction)   . Thrombus     Current Outpatient Prescriptions  Medication Sig Dispense Refill  . carvedilol (COREG) 25 MG tablet TAKE 1 TABLET WITH FOOD TWICE A DAY ORALLY 180 tablet 1  . furosemide (LASIX) 40 MG tablet TAKE ONE TABLET BY MOUTH TWICE DAILY - MAY TAKE AN EXTRA TABLET DAILYIF NEEDED AS DIRECTED (Patient taking differently: PT TAKES 1 1/2 TABS Q HS) 200  tablet 1  . lisinopril (PRINIVIL,ZESTRIL) 5 MG tablet TAKE 1 TABLET BY MOUTH DAILY 90 tablet 0  . Potassium Chloride ER 20 MEQ TBCR TAKE 1 TABLET BY MOUTH TWICE DAILY 60 tablet 6  . simvastatin (ZOCOR) 10 MG tablet TAKE ONE TABLET BY MOUTH DAILY IN THE EVENING 90 tablet 0  . warfarin (COUMADIN) 2.5 MG tablet Take as directed by [facility] 30 tablet 2   No current facility-administered medications for this visit.    Allergies:   No Known Allergies  Social History:  The patient  reports that he has quit smoking. He has never used smokeless tobacco. He reports that he does not drink alcohol or use illicit drugs.   Family History:  The patient's family history includes Heart failure in his brother; Stomach cancer in his father; Stroke in his mother.   ROS:  Please see the history of present illness.  No nausea, vomiting.  No fevers, chills.  No focal weakness.  No dysuria. Fluid overload better.  Taking extra furosemide occasionally, 1x/week.  Feels that he is more irritable.  All other systems reviewed and negative.   PHYSICAL EXAM: VS:  BP 90/60 mmHg  Pulse 71  Ht  (1.702 m)  Wt 150 lb (68.04 kg)  BMI 23.49  kg/m2 Well nourished, well developed, in no acute distress; appears euvolemic HEENT: normal Neck: no JVD, no carotid bruits Cardiac:  normal S1, S2; RRR; premature beats , 3/6 systolic murmur Lungs:  clear to auscultation bilaterally, no wheezing, rhonchi or rales Abd: soft, nontender, no hepatomegaly Ext: no edema Skin: warm and dry Neuro:   no focal abnormalities noted Psych: normal afect     ASSESSMENT AND PLAN:  Treatment  1. SOB  Improved.   BNP was elevated  A few months ago. Continue furosemide to 60 mg daily- takes it on an empty stomach at 1AM . Can take extra 40 mg based on weight and SHOB, but if he takes this with food, there is no effect. No lower extremity edema.  Sx are much better at this time.    2. PVC       Notes: PVCs noted in the past.  This may account for the irregularity of his pulse. Certainly, if he has flipped into atrial fibrillation, this could account for some shortness of breath. Several PVCs today.     3. Chronic systolic heart failure  Notes: EF below 30% in the past.  Declined AICD in the past.  Likely the cause of severe MR. Follow weights at home.  Would recommend getting a scale.  Decrease lisinopril to 2.5 mg daily due to low BP.  He can predict when he will have fluid overload, typically when he eats something salty.   4. Anticoagulant monitoring  Notes: INR to be checked and Coumadin dose to be adjusted with Pharm.D.  Sometimes misses doses.     5. Severe MR:  Worse in 2014 compared to 2011 which likely explained the fluid overload in 2014.  I do not think the MR is the cause of his cardiomyopathy.  I think the cardiomyopathy and LV dilatation is the cause of his MR.   Still not interested with invasive management.  Manage with diuretics.   Lipids reviewed.  Well controlled at last check.  COntinue simvastatin.  Recheck lipids today.    Lab: BNP  B-NATRIURETIC PEPTIDE in 6/14 1450 H 0-100 - pg/mL  This has correlated with fluid overload in the past.    Signed, Fredric Mare, MD, [facility] 11/30/2014 12:05 PM

## 2014-12-02 ENCOUNTER — Telehealth: Payer: Self-pay | Admitting: Interventional Cardiology

## 2014-12-02 DIAGNOSIS — E875 Hyperkalemia: Secondary | ICD-10-CM

## 2014-12-02 NOTE — Telephone Encounter (Signed)
New problem ° ° °Pt returning your call concerning lab results.  °

## 2014-12-02 NOTE — Telephone Encounter (Signed)
Labs results given to pt and wife . Pt and wife are aware to decreased potassium to 20 mEq once daily and to repeat BMET in one to 2 weeks. Pt has an appointment for labs on 12/14/14 at 12:30 PM pt and wife are aware.

## 2014-12-02 NOTE — Telephone Encounter (Signed)
Pt's wife calling back, pls call [phone]

## 2014-12-02 NOTE — Telephone Encounter (Signed)
Left pt a message to call back. 

## 2014-12-03 ENCOUNTER — Other Ambulatory Visit: Payer: Self-pay | Admitting: *Deleted

## 2014-12-03 ENCOUNTER — Encounter: Payer: Self-pay | Admitting: *Deleted

## 2014-12-03 DIAGNOSIS — R7989 Other specified abnormal findings of blood chemistry: Secondary | ICD-10-CM

## 2014-12-03 DIAGNOSIS — R945 Abnormal results of liver function studies: Secondary | ICD-10-CM

## 2014-12-12 ENCOUNTER — Other Ambulatory Visit: Payer: Self-pay | Admitting: Interventional Cardiology

## 2014-12-14 ENCOUNTER — Ambulatory Visit (INDEPENDENT_AMBULATORY_CARE_PROVIDER_SITE_OTHER): Payer: Medicare Other | Admitting: *Deleted

## 2014-12-14 ENCOUNTER — Other Ambulatory Visit (INDEPENDENT_AMBULATORY_CARE_PROVIDER_SITE_OTHER): Payer: Medicare Other | Admitting: *Deleted

## 2014-12-14 DIAGNOSIS — E875 Hyperkalemia: Secondary | ICD-10-CM

## 2014-12-14 DIAGNOSIS — I749 Embolism and thrombosis of unspecified artery: Secondary | ICD-10-CM

## 2014-12-14 LAB — BASIC METABOLIC PANEL
BUN: 16 mg/dL (ref 6–23)
CHLORIDE: 99 meq/L (ref 96–112)
CO2: 31 mEq/L (ref 19–32)
Calcium: 9.1 mg/dL (ref 8.4–10.5)
Creatinine, Ser: 0.75 mg/dL (ref 0.40–1.50)
GFR: 109.03 mL/min (ref >60.00)
Glucose, Bld: 89 mg/dL (ref 70–99)
POTASSIUM: 4.1 meq/L (ref 3.5–5.1)
Sodium: 137 mEq/L (ref 135–145)

## 2014-12-14 LAB — POCT INR: INR: 4.4

## 2014-12-28 ENCOUNTER — Ambulatory Visit (INDEPENDENT_AMBULATORY_CARE_PROVIDER_SITE_OTHER): Payer: Medicare Other | Admitting: *Deleted

## 2014-12-28 DIAGNOSIS — I749 Embolism and thrombosis of unspecified artery: Secondary | ICD-10-CM

## 2014-12-28 LAB — POCT INR: INR: 2.6

## 2015-01-03 ENCOUNTER — Other Ambulatory Visit: Payer: Self-pay | Admitting: Interventional Cardiology

## 2015-01-04 NOTE — Telephone Encounter (Signed)
KEEP APPT SCHEDULED FOR MORE REFILLS

## 2015-01-07 ENCOUNTER — Telehealth: Payer: Self-pay | Admitting: Interventional Cardiology

## 2015-01-07 NOTE — Telephone Encounter (Signed)
Walk in pt form " Jury summons" dropped off gave to [employer].

## 2015-01-11 ENCOUNTER — Telehealth: Payer: Self-pay | Admitting: Interventional Cardiology

## 2015-01-11 DIAGNOSIS — I420 Dilated cardiomyopathy: Secondary | ICD-10-CM

## 2015-01-11 DIAGNOSIS — I059 Rheumatic mitral valve disease, unspecified: Secondary | ICD-10-CM

## 2015-01-11 NOTE — Telephone Encounter (Signed)
I spoke with the patient. He reports having increased intermittent ankle edema bilaterally over the last 1-2 weeks. He states his edema may go 8" above the ankles. He did eat popcorn last night, which he understands is salty, but he has had more frequent episodes of ankle edema prior to that. He reports taking his lasix about 1230 am because it has to be on an empty stomach for it to be effective for him. He is currently taking 40 mg two tablets once daily (at 12 am) and potassium 20 meq once daily. He reports that he will urinate about 7 x / night. Recently, he has only been urinating about 5 x/ night. The last two nights he has taken lasix 40 mg 2 & 1/2 tablets with no increased frequency in his urination. He reports non-pitting edema, but he has not weighed either. He states his weight is usually about 151 lbs, but he may be up about 5 lbs. He reports no increased SOB. I advised the patient I would review with Dr. Eldridge Dace since increasing his lasix may be a little more difficult if he can only take this at night/ early am. I will call him back with recommendations.

## 2015-01-11 NOTE — Telephone Encounter (Signed)
New Message     Pt c/o medication issue:  1. Name of Medication: Furosmide   2. How are you currently taking this medication (dosage and times per day)? 40mg  per tablet 2 during day and 1 if needed.  3. Are you having a reaction (difficulty breathing--STAT)? Swelling in ankle  4. What is your medication issue? Heart issue   Please give patient a call

## 2015-01-11 NOTE — Telephone Encounter (Signed)
Reviewed with Dr. Eldridge Dace. He recommends no medication changes at present, but would like to repeat the patient's echocardiogram since his last one was 04/2013 and his EF was 20-25 % at the time. I left a message for the patient to call back.

## 2015-01-12 NOTE — Telephone Encounter (Signed)
Patient is agreeable to having an ECHO since his last one was 04/2013. Patient verbalized understanding that there are no medication changes at this time also. ECHO ordered. Appointment information will be called to patient by scheduling department.

## 2015-01-12 NOTE — Telephone Encounter (Signed)
F/u    Pt returning call from nurse from yesterday. Please call pt.

## 2015-01-13 ENCOUNTER — Telehealth: Payer: Self-pay | Admitting: Interventional Cardiology

## 2015-01-13 DIAGNOSIS — I509 Heart failure, unspecified: Secondary | ICD-10-CM

## 2015-01-13 NOTE — Telephone Encounter (Signed)
New Msg          Pt wife Darl Pikes calling, states pt is having some fluid issues and she would like to spk with nurse.    Please return call at [phone].

## 2015-01-13 NOTE — Telephone Encounter (Signed)
Spoke with pt and his wife. Wife states that pt's ankles are still swollen after taking 80mg  of Lasix last night.  Wife states that pt's ankles are swollen above his shoes.  Wife states that this has been going on for about 2 weeks now.   Wife states that she believes that Lasix is no longer working for the pt. Pt and wife believe that he needs to be switched to a different medication. Pt's wife asked about a possible combination pill. Wife also states that pt's ankles look slightly better today. Suggested echo from previous phone call is scheduled for 4/25. Informed the wife and pt that I would send this information over to Dr. Eldridge Dace for review and advisement. Pt and wife verbalized understanding and were in agreement with this plan.

## 2015-01-14 MED ORDER — TORSEMIDE 20 MG PO TABS: 40.0000 mg | ORAL_TABLET | Freq: Every day | ORAL | Status: DC

## 2015-01-14 NOTE — Telephone Encounter (Signed)
Spoke with pharmacist, Audrie Lia, who states that Torsemide 40mg  daily is the equivalent to pt's dose. Spoke with pt's wife and informed her of medication change and for pt to come in for lab appt in one week. Verified pharmacy and informed wife that I would send prescription over to the pharmacy. Made appt for 01/20/15 for labs. Also informed wife that ultimately pt may need a referral to CHF clinic. Wife verbalized understanding and was in agreement with Dr. Hoyle Barr orders.

## 2015-01-14 NOTE — Telephone Encounter (Signed)
Left message to call back  

## 2015-01-14 NOTE — Telephone Encounter (Signed)
OK to try equivalent dose of torsemide instead of furosemide.  Please check with pharmacy to see what is comparable dose.  BMet in one week.  Ultimately, may need referral to CHF clinic.

## 2015-01-15 ENCOUNTER — Encounter: Payer: Self-pay | Admitting: *Deleted

## 2015-01-15 NOTE — Telephone Encounter (Signed)
I spoke with the patient and advised him that Dr. Eldridge Dace approved a letter for the patient to be dismissed from jury duty on 2/29. I advised him that I cannot get this to the court for him. I have given him the # to call the court the night before his scheduled appearance with his juror # as he left the original copy at the office. I have advised him I will put his original summons with a copy of the letter at the front desk in case he is not called off on Sunday night, he can pick this up on Monday morning. He voices understanding.

## 2015-01-15 NOTE — Telephone Encounter (Signed)
Walk in form was not given directly to me on 2/18. This form appeared in Dr. Hoyle Barr box on 2/25.

## 2015-01-20 ENCOUNTER — Ambulatory Visit (INDEPENDENT_AMBULATORY_CARE_PROVIDER_SITE_OTHER): Payer: Medicare Other | Admitting: *Deleted

## 2015-01-20 ENCOUNTER — Other Ambulatory Visit (INDEPENDENT_AMBULATORY_CARE_PROVIDER_SITE_OTHER): Payer: Medicare Other | Admitting: *Deleted

## 2015-01-20 DIAGNOSIS — I749 Embolism and thrombosis of unspecified artery: Secondary | ICD-10-CM

## 2015-01-20 DIAGNOSIS — I509 Heart failure, unspecified: Secondary | ICD-10-CM

## 2015-01-20 LAB — BASIC METABOLIC PANEL
BUN: 16 mg/dL (ref 6–23)
CHLORIDE: 97 meq/L (ref 96–112)
CO2: 34 mEq/L — ABNORMAL HIGH (ref 19–32)
Calcium: 9.1 mg/dL (ref 8.4–10.5)
Creatinine, Ser: 1 mg/dL (ref 0.40–1.50)
GFR: 78.21 mL/min (ref >60.00)
Glucose, Bld: 105 mg/dL — ABNORMAL HIGH (ref 70–99)
POTASSIUM: 3.5 meq/L (ref 3.5–5.1)
Sodium: 135 mEq/L (ref 135–145)

## 2015-01-20 LAB — POCT INR: INR: 2.7

## 2015-01-21 ENCOUNTER — Telehealth: Payer: Self-pay | Admitting: Interventional Cardiology

## 2015-01-21 NOTE — Telephone Encounter (Signed)
I called the patient's home # and spoke with his wife regarding his most recent lab results. She states he is still having quite a bit of swelling and wanted to know if the patient cold take more furosemide. Creatinine is 1.0 on most recent lab. The patient is currently taking torsemide 20 mg two tablets in the morning. I have advised her that the patient may increase torsemide to 20 mg two tablets in the morning and an additional one tablet at noon for 3 days, then resume current dose. I have encouraged her to have him weigh as he has not been doing this. I have also encouraged her to have the patient schedule his echo sooner than 4/25. She states they have company now, but she will relay the message to him.

## 2015-01-26 ENCOUNTER — Other Ambulatory Visit: Payer: Self-pay | Admitting: Interventional Cardiology

## 2015-02-02 ENCOUNTER — Telehealth: Payer: Self-pay | Admitting: Interventional Cardiology

## 2015-02-02 DIAGNOSIS — I420 Dilated cardiomyopathy: Secondary | ICD-10-CM

## 2015-02-02 MED ORDER — TORSEMIDE 20 MG PO TABS: ORAL_TABLET | ORAL | Status: DC

## 2015-02-02 NOTE — Telephone Encounter (Signed)
New Msg     Pt c/o medication issue:  1. Name of Medication: Furosemide   2. How are you currently taking this medication (dosage and times per day)? 2 1/2 tablets daily   3. Are you having a reaction (difficulty breathing--STAT)? No  4. What is your medication issue? Pt is still retaining fluid and is running out of pills. He's taking 2 1/2 a day when he feels a lot of fluid but this is making him run out  Pt needs refill. Can he get prescribed a larger quantity?   Please return call.

## 2015-02-02 NOTE — Telephone Encounter (Signed)
The pts wife states that the pt has continued to take Torsemide 20 mg TID since 01/21/15 and that he is running out of Torsemide.  Per Dr Eldridge Dace the pts wife is advised that the pt may continue to take Torsemide 20 mg TID and to have a BMET drawn soon. The pts wife verbalized understanding. [facility]semide [facility] to [facility] to [facility] per the pts wife request and a BMET has been ordered and scheduled to be drawn tomorrow, the pts wife states that the pt agrees with date and time of lab draw.

## 2015-02-03 ENCOUNTER — Other Ambulatory Visit: Payer: Medicare Other

## 2015-02-03 ENCOUNTER — Telehealth: Payer: Self-pay | Admitting: Interventional Cardiology

## 2015-02-03 MED ORDER — TORSEMIDE 20 MG PO TABS: ORAL_TABLET | ORAL | Status: DC

## 2015-02-03 NOTE — Telephone Encounter (Signed)
Called patient back. Order was put in yesterday to [facility]. Patient has changed pharmacy to [facility] on [address] in [place]. Torsemide 40 mg in AM by mouth and 20 mg in PM by mouth sent to patient's pharmacy of choice.

## 2015-02-03 NOTE — Telephone Encounter (Signed)
New Msg       Pt calling about a prescription for  Torsemide, would like to have it sent to    [facility] on [address] [place]. Telephone # [phone].   Pt request call once it has been sent to pharmacy.

## 2015-02-17 ENCOUNTER — Ambulatory Visit (INDEPENDENT_AMBULATORY_CARE_PROVIDER_SITE_OTHER): Payer: Medicare Other | Admitting: *Deleted

## 2015-02-17 DIAGNOSIS — I749 Embolism and thrombosis of unspecified artery: Secondary | ICD-10-CM | POA: Diagnosis not present

## 2015-02-17 LAB — POCT INR
INR: 4.3
INR: 4.3

## 2015-02-23 IMAGING — CT CT ABD-PELV W/ CM
2 of 5 series · 16 of 46 positions shown, 18 images · IV contrast (Omnipaque 300)
Comparison: None

CLINICAL DATA: Elevated liver function tests, decreased appetite,
history of right inguinal hernia

EXAM:
CT ABDOMEN AND PELVIS WITH CONTRAST
TECHNIQUE: Multidetector CT imaging of the abdomen and pelvis was performed
using the standard protocol following bolus administration of
intravenous contrast.
CONTRAST:  100mL OMNIPAQUE IOHEXOL 300 MG/ML  SOLN

[Series 2: abd/ pel 5mm · axial · 0.66mm/px · z∈[-454,-88]mm · 13 of 83 slices shown, 15 images]
[im 5/83  soft-tissue]
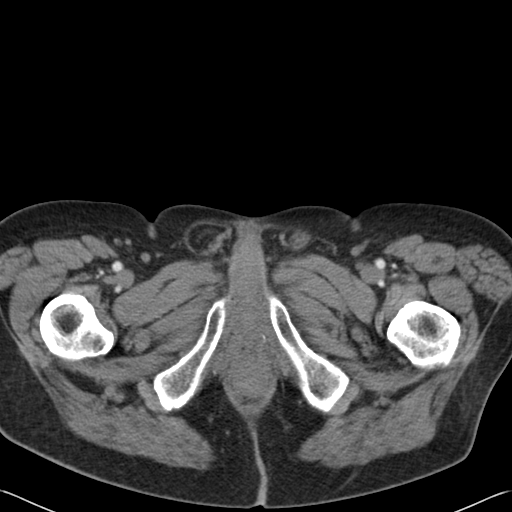
[im 5/83  bone]
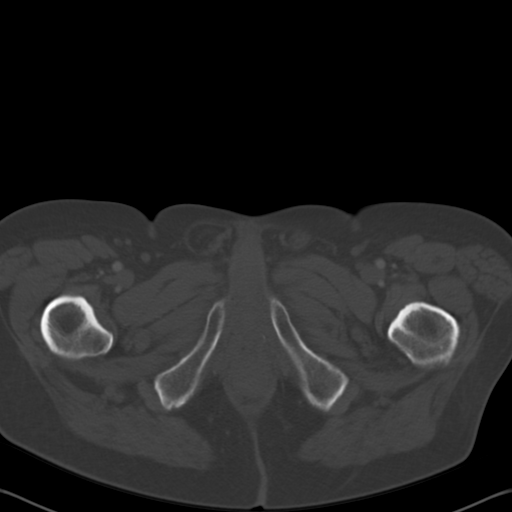
[im 13/83  soft-tissue]
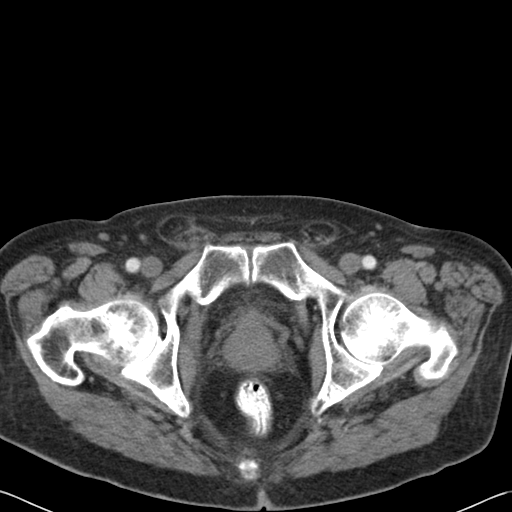
[im 17/83  soft-tissue]
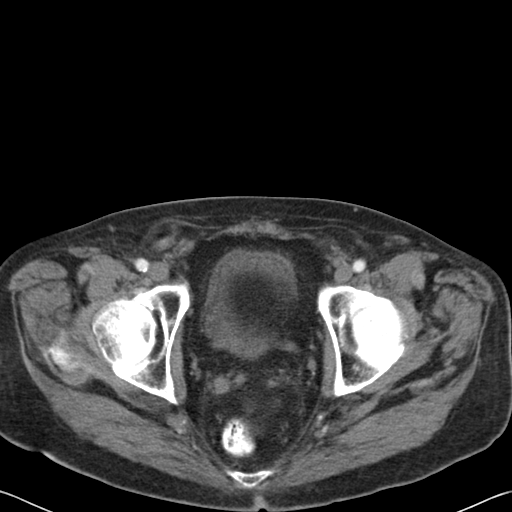
[im 25/83  soft-tissue]
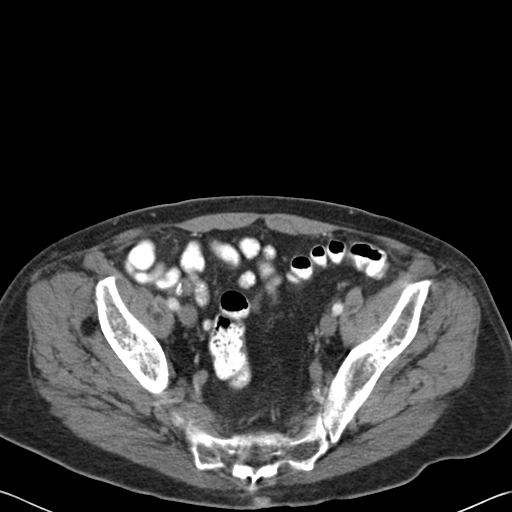
[im 29/83  soft-tissue]
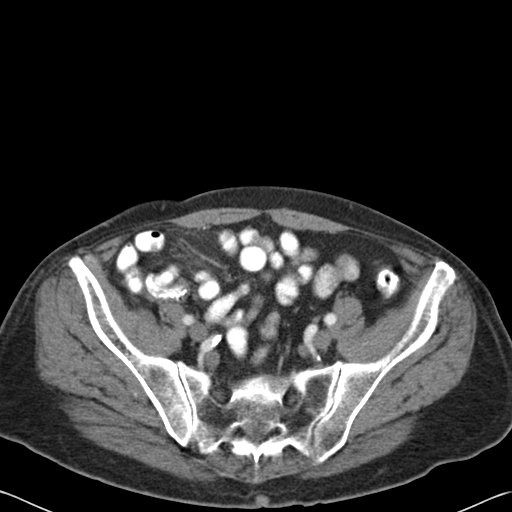
[im 37/83  soft-tissue]
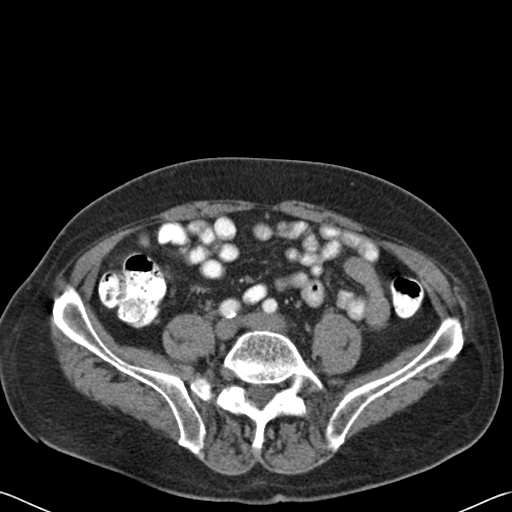
[im 42/83  soft-tissue]
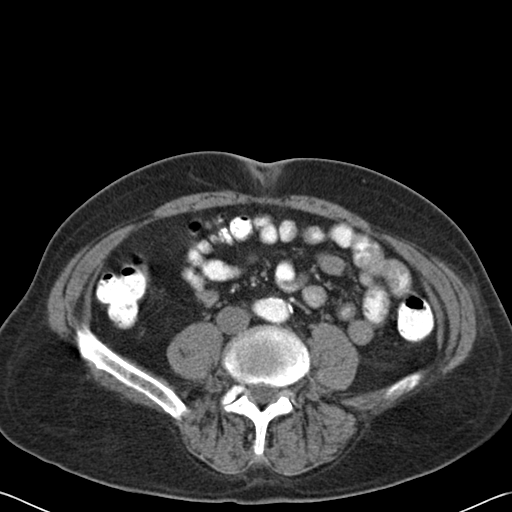
[im 46/83  soft-tissue]
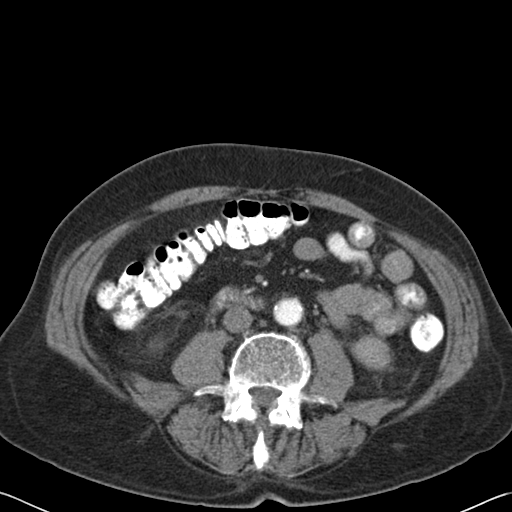
[im 54/83  soft-tissue]
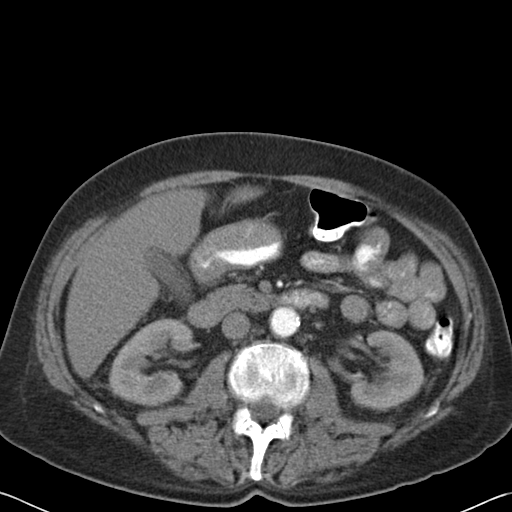
[im 54/83  bone]
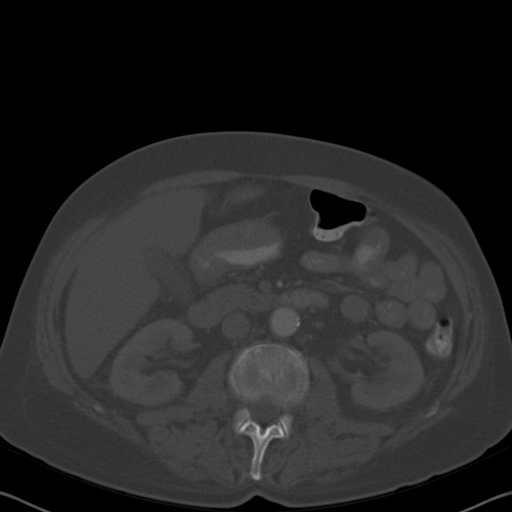
[im 58/83  soft-tissue]
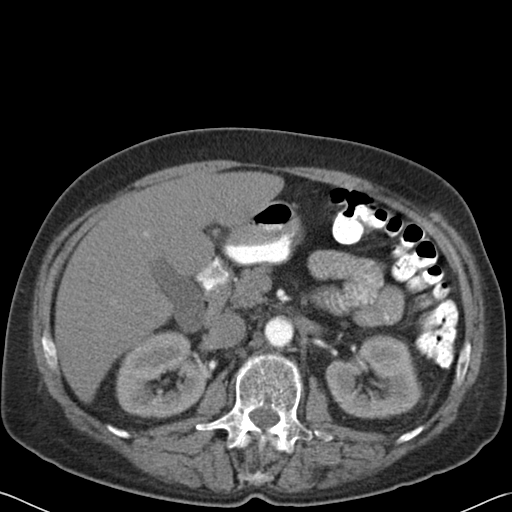
[im 66/83  soft-tissue]
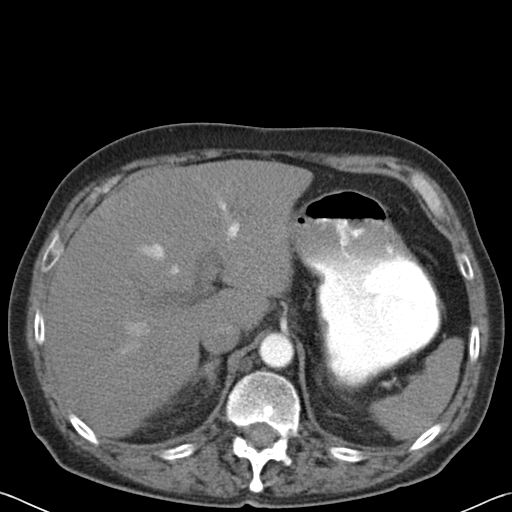
[im 70/83  soft-tissue]
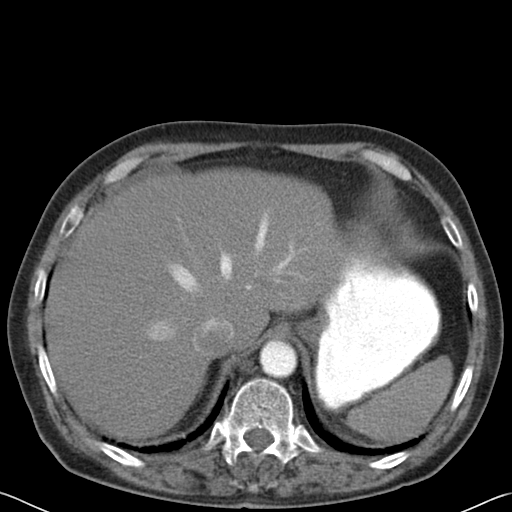
[im 78/83  soft-tissue]
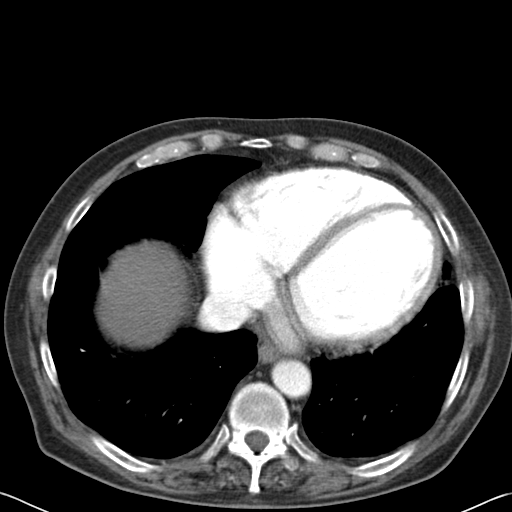

[Series 602: cor · coronal · 0.83mm/px · 3 of 116 slices shown]
[im 39/116  soft-tissue]
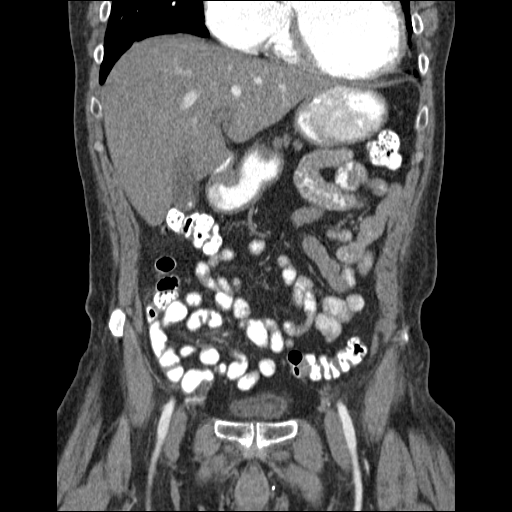
[im 52/116  soft-tissue]
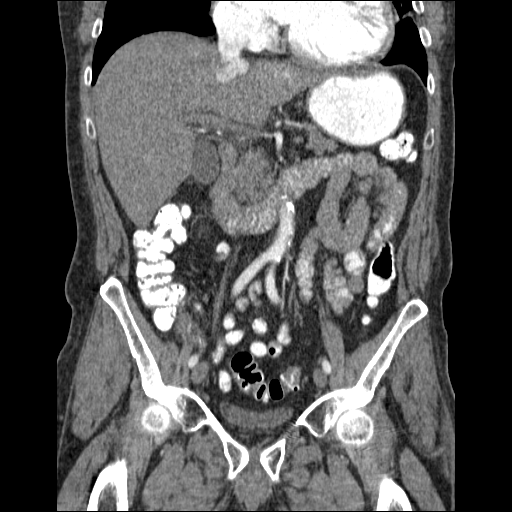
[im 64/116  soft-tissue]
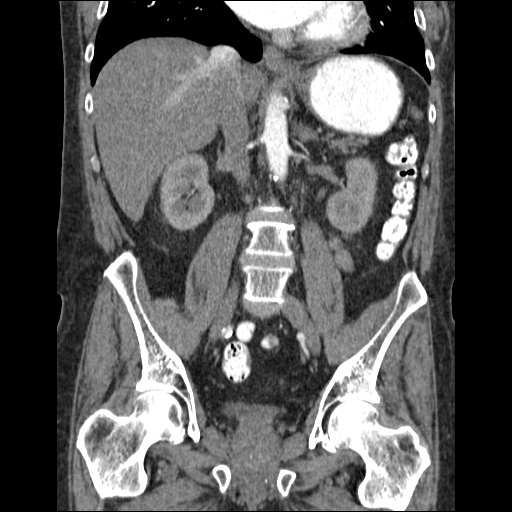

[16 of 46 positions shown; findings below may reference images not displayed]

FINDINGS: The lung bases are clear with mild interstitial prominence possibly
chronic posteriorly. There is cardiomegaly noted. Some reflux of
contrast is noted into the inferior vena cava and hepatic veins
suggesting a degree of right heart dysfunction. The liver enhances
and appears somewhat inhomogeneous particularly on the delayed
images which may indicate fatty infiltration. No focal hepatic
lesion is seen and no ductal dilatation is noted. There may be a
small faintly calcified gallstone present layering within the
gallbladder but the gallbladder wall is not thickened. The pancreas
appears normal in the pancreatic duct is not dilated. The adrenal
glands and spleen are unremarkable. The stomach is moderately fluid
distended with no abnormality noted. The kidneys enhance, and there
does appear to be a faintly calcified small right renal artery
aneurysm present of 7 mm in diameter. No definite renal calculi are
seen and on delayed images. The abdominal aorta is normal in
caliber. No adenopathy is seen with moderate atheromatous change
within the abdominal aorta.

The urinary bladder is completely decompressed and cannot be
evaluated. Fat enters both inguinal canals right slightly larger
than left with a small amount of small bowel entering the proximal
right inguinal canal. The prostate is normal in size. There is a
tiny amount of fluid in the pelvis which is somewhat unusual in a
male patient and may indicate gastroenteritis in the proper clinical
setting. The terminal ileum is unremarkable. The appendix is not
definitely seen. The lumbar vertebrae are in normal alignment.
IMPRESSION: 1. Inhomogeneous liver may indicate fatty infiltration. No focal
abnormality is seen.
2. Moderate cardiomegaly with reflux of contrast into the IVC and
hepatic veins which may indicate a degree of right heart
dysfunction.
3. Question small gallstone within the gallbladder.
4. 7 mm right renal artery aneurysm.
5. Fat enters both inguinal canals with a small amount of small
bowel entering the right inguinal canal proximally without
obstruction.
6. Small amount of fluid in the pelvis.  Question gastroenteritis.

## 2015-03-03 ENCOUNTER — Ambulatory Visit (INDEPENDENT_AMBULATORY_CARE_PROVIDER_SITE_OTHER): Payer: Medicare Other | Admitting: *Deleted

## 2015-03-03 DIAGNOSIS — I749 Embolism and thrombosis of unspecified artery: Secondary | ICD-10-CM | POA: Diagnosis not present

## 2015-03-03 LAB — POCT INR: INR: 3

## 2015-03-08 ENCOUNTER — Other Ambulatory Visit: Payer: Self-pay | Admitting: Interventional Cardiology

## 2015-03-15 ENCOUNTER — Other Ambulatory Visit (HOSPITAL_COMMUNITY): Payer: Medicare Other

## 2015-03-31 ENCOUNTER — Other Ambulatory Visit (INDEPENDENT_AMBULATORY_CARE_PROVIDER_SITE_OTHER): Payer: Medicare Other

## 2015-03-31 ENCOUNTER — Ambulatory Visit (INDEPENDENT_AMBULATORY_CARE_PROVIDER_SITE_OTHER): Payer: Medicare Other | Admitting: *Deleted

## 2015-03-31 DIAGNOSIS — I420 Dilated cardiomyopathy: Secondary | ICD-10-CM

## 2015-03-31 DIAGNOSIS — I749 Embolism and thrombosis of unspecified artery: Secondary | ICD-10-CM | POA: Diagnosis not present

## 2015-03-31 LAB — BASIC METABOLIC PANEL WITH GFR
BUN: 21 mg/dL (ref 6–23)
CO2: 31 meq/L (ref 19–32)
Calcium: 9 mg/dL (ref 8.4–10.5)
Chloride: 97 meq/L (ref 96–112)
Creatinine, Ser: 1.07 mg/dL (ref 0.40–1.50)
GFR: 72.29 mL/min
Glucose, Bld: 93 mg/dL (ref 70–99)
Potassium: 3.9 meq/L (ref 3.5–5.1)
Sodium: 134 meq/L — ABNORMAL LOW (ref 135–145)

## 2015-03-31 LAB — POCT INR: INR: 3.2

## 2015-04-14 ENCOUNTER — Ambulatory Visit (INDEPENDENT_AMBULATORY_CARE_PROVIDER_SITE_OTHER): Payer: Medicare Other

## 2015-04-14 DIAGNOSIS — I749 Embolism and thrombosis of unspecified artery: Secondary | ICD-10-CM

## 2015-04-14 LAB — POCT INR: INR: 3.3

## 2015-04-20 ENCOUNTER — Other Ambulatory Visit: Payer: Self-pay | Admitting: Interventional Cardiology

## 2015-05-07 ENCOUNTER — Ambulatory Visit (INDEPENDENT_AMBULATORY_CARE_PROVIDER_SITE_OTHER): Payer: Medicare Other | Admitting: *Deleted

## 2015-05-07 DIAGNOSIS — I749 Embolism and thrombosis of unspecified artery: Secondary | ICD-10-CM

## 2015-05-07 LAB — POCT INR: INR: 2.7

## 2015-05-31 ENCOUNTER — Other Ambulatory Visit: Payer: Medicare Other

## 2015-06-04 ENCOUNTER — Other Ambulatory Visit: Payer: Medicare Other

## 2015-06-04 ENCOUNTER — Ambulatory Visit (INDEPENDENT_AMBULATORY_CARE_PROVIDER_SITE_OTHER): Payer: Medicare Other

## 2015-06-04 DIAGNOSIS — I749 Embolism and thrombosis of unspecified artery: Secondary | ICD-10-CM | POA: Diagnosis not present

## 2015-06-04 LAB — POCT INR: INR: 2.4

## 2015-06-08 ENCOUNTER — Other Ambulatory Visit (INDEPENDENT_AMBULATORY_CARE_PROVIDER_SITE_OTHER): Payer: Medicare Other

## 2015-06-08 DIAGNOSIS — R945 Abnormal results of liver function studies: Secondary | ICD-10-CM

## 2015-06-08 DIAGNOSIS — R7989 Other specified abnormal findings of blood chemistry: Secondary | ICD-10-CM | POA: Diagnosis not present

## 2015-06-08 LAB — HEPATIC FUNCTION PANEL
ALBUMIN: 4 g/dL (ref 3.5–5.2)
ALT: 18 U/L (ref 0–53)
AST: 25 U/L (ref 0–37)
Alkaline Phosphatase: 178 U/L — ABNORMAL HIGH (ref 39–117)
BILIRUBIN TOTAL: 4.4 mg/dL — AB (ref 0.2–1.2)
Bilirubin, Direct: 2.1 mg/dL — ABNORMAL HIGH (ref 0.0–0.3)
Total Protein: 6.6 g/dL (ref 6.0–8.3)

## 2015-07-08 ENCOUNTER — Encounter: Payer: Self-pay | Admitting: Interventional Cardiology

## 2015-07-09 ENCOUNTER — Other Ambulatory Visit: Payer: Self-pay | Admitting: Adult Health

## 2015-07-16 ENCOUNTER — Ambulatory Visit (INDEPENDENT_AMBULATORY_CARE_PROVIDER_SITE_OTHER): Payer: Medicare Other | Admitting: Pharmacist

## 2015-07-16 DIAGNOSIS — I749 Embolism and thrombosis of unspecified artery: Secondary | ICD-10-CM

## 2015-07-16 LAB — POCT INR: INR: 2.1

## 2015-07-23 ENCOUNTER — Telehealth: Payer: Self-pay | Admitting: Interventional Cardiology

## 2015-07-23 MED ORDER — CARVEDILOL 12.5 MG PO TABS: 12.5000 mg | ORAL_TABLET | Freq: Two times a day (BID) | ORAL | Status: DC

## 2015-07-23 NOTE — Telephone Encounter (Signed)
Spoke with pt and he states that his BP has been running extremely low. Pt repeated BPs as written below in original message.   HR has been in the 80s consistently. Pt denies any swelling or CP. Pt does complain of dizziness, lightheadedness, occasional blurred vision and feeling like he is going to pass out when his BP is low. Pt states that SBP gets up into 90's when he is exerting and DBP gets down into the 30's occasionally. Wife states that pt eats and drinks well. Spoke with Dr. Eldridge Dace and he said to have pt cut his Coreg back to 12.5mg  BID. Spoke with pt's wife, DPR on file and informed her of this information. Wife verbalized understanding and was in agreement with this plan.

## 2015-07-23 NOTE — Telephone Encounter (Signed)
New message    Pt c/o BP issue: STAT if pt c/o blurred vision, one-sided weakness or slurred speech  1. What are your last 5 BP readings? 8-30 79/51; 8-31 75/43; 9-1 74/43; 9-2 83/44  2. Are you having any other symptoms (ex. Dizziness, headache, blurred vision, passed out)? Dizziness; some blurred vision   3. What is your BP issue? Running lower than normal  Pt thinks it could be medication  Pt states he takes furosemide

## 2015-07-28 ENCOUNTER — Ambulatory Visit: Payer: Medicare Other | Admitting: Gastroenterology

## 2015-08-02 ENCOUNTER — Other Ambulatory Visit: Payer: Self-pay | Admitting: Interventional Cardiology

## 2015-08-24 ENCOUNTER — Telehealth: Payer: Self-pay | Admitting: Interventional Cardiology

## 2015-08-24 NOTE — Telephone Encounter (Signed)
The pt is advised and he states that he has been taking Potassium 20 meq just once daily. He is advised to continue and he verbalized understanding.

## 2015-08-24 NOTE — Telephone Encounter (Signed)
LMTCB. According to the pts last CMET on 11/30/14 he should be taking Potassium 20 meq daily.

## 2015-08-24 NOTE — Telephone Encounter (Signed)
New Message  Pt wanted to verify new change to potassium meds- 1 tab or 2 tab per day. Please call back and discuss.

## 2015-08-27 ENCOUNTER — Ambulatory Visit (INDEPENDENT_AMBULATORY_CARE_PROVIDER_SITE_OTHER): Payer: Medicare Other | Admitting: *Deleted

## 2015-08-27 DIAGNOSIS — I749 Embolism and thrombosis of unspecified artery: Secondary | ICD-10-CM | POA: Diagnosis not present

## 2015-08-27 LAB — POCT INR: INR: 1.6

## 2015-09-03 ENCOUNTER — Ambulatory Visit (INDEPENDENT_AMBULATORY_CARE_PROVIDER_SITE_OTHER): Payer: Medicare Other | Admitting: Interventional Cardiology

## 2015-09-03 ENCOUNTER — Encounter: Payer: Self-pay | Admitting: Interventional Cardiology

## 2015-09-03 VITALS — BP 108/58 | HR 92 | Ht 67.0 in | Wt 146.0 lb

## 2015-09-03 DIAGNOSIS — I493 Ventricular premature depolarization: Secondary | ICD-10-CM

## 2015-09-03 DIAGNOSIS — I059 Rheumatic mitral valve disease, unspecified: Secondary | ICD-10-CM

## 2015-09-03 DIAGNOSIS — E785 Hyperlipidemia, unspecified: Secondary | ICD-10-CM

## 2015-09-03 DIAGNOSIS — I420 Dilated cardiomyopathy: Secondary | ICD-10-CM

## 2015-09-03 DIAGNOSIS — Z7901 Long term (current) use of anticoagulants: Secondary | ICD-10-CM | POA: Insufficient documentation

## 2015-09-03 NOTE — Patient Instructions (Signed)
Medication Instructions:  Same-no changes  Labwork: None  Testing/Procedures: None  Follow-Up: Your physician wants you to follow-up in: 6 months. You will receive a reminder letter in the mail two months in advance. If you don't receive a letter, please call our office to schedule the follow-up appointment.      

## 2015-09-03 NOTE — Progress Notes (Signed)
Patient ID: Charles Shaw, male   DOB: 17-Sep-1941, 74 y.o.   MRN: [id number]     Cardiology Office Note   Date:  09/03/2015   ID:  Charles Shaw, DOB [birth year], MRN [id number]  PCP:  No PCP Per Patient    No chief complaint on file.  follow-up CHF   Wt Readings from Last 3 Encounters:  09/03/15 146 lb (66.225 kg)  11/30/14 150 lb (68.04 kg)  04/14/14 147 lb (66.679 kg)       History of Present Illness: Charles Shaw is a 74 y.o. male  who has a familial nonischemic cardiomyopathy. He has been on medical therapy. His lifestyle has improved quite a bit. He has stopped smoking and drinking. He has been on coumadin since 2006 when he had a TIA. No bleeding problems. COumadin is steady.   He has had episodic fluid retention in the past. He improved with increased Lasix. Most recently, a month ago, he did not improve with increased Lasix. He was switched to torsemide. He lost a significant amount of fluid. He did not weigh himself during that time. He still takes the diuretic at night. He has more effect from the diuretic when taken on an empty stomach.  He had low blood pressure readings as well. His carvedilol was decreased.  Cardiomyopathy:  Denies : Chest pain.  Leg edema.  Orthopnea.  Paroxysmal nocturnal dyspnea.  Palpitations.  Syncope.    DOE with walking up stairs improved. He continues to not be interested in any invasive therapy. If he feels that things are getting worse, he will let us know.     Past Medical History  Diagnosis Date  . Hyperlipidemia   . Anxiety   . Heart failure (HCC)   . PVC (premature ventricular contraction)   . Thrombus     Past Surgical History  Procedure Laterality Date  . Spinal cyst       Current Outpatient Prescriptions  Medication Sig Dispense Refill  . carvedilol (COREG) 12.5 MG tablet Take 1 tablet (12.5 mg total) by mouth 2 (two) times daily. 180 tablet 3  . lisinopril (PRINIVIL,ZESTRIL) 5 MG tablet TAKE 1 TABLET BY  MOUTH DAILY 90 tablet 0  . potassium chloride SA (K-DUR,KLOR-CON) 20 MEQ tablet Take 20 mEq by mouth daily.    . simvastatin (ZOCOR) 10 MG tablet TAKE ONE TABLET BY MOUTH DAILY IN THE EVENING 90 tablet 0  . torsemide (DEMADEX) 20 MG tablet Take 40 mg in the AM by mouth and 20 mg in the PM by mouth 270 tablet 3  . warfarin (COUMADIN) 2.5 MG tablet TAKE AS DIRECTED BY COUMADIN CLINIC 20 tablet 3   No current facility-administered medications for this visit.    Allergies:   Review of patient's allergies indicates no known allergies.    Social History:  The patient  reports that he has quit smoking. He has never used smokeless tobacco. He reports that he does not drink alcohol or use illicit drugs.   Family History:  The patient's family history includes Heart failure in his brother; Stomach cancer in his father; Stroke in his mother.    ROS:  Please see the history of present illness.   Otherwise, review of systems are positive for recent fluid overload.   All other systems are reviewed and negative.    PHYSICAL EXAM: VS:  BP 108/58 mmHg  Pulse 92  Ht 5\' 7"  (1.702 m)  Wt 146 lb (66.225 kg)  BMI 22.86 kg/m2 , BMI Body  mass index is 22.86 kg/(m^2). GEN: Well nourished, well developed, in no acute distress HEENT: normal Neck: no JVD, carotid bruits, or masses Cardiac: RRR; 1/6 systolic murmur murmurs, rubs, or gallops,no edema , premature beats Respiratory:  clear to auscultation bilaterally, normal work of breathing GI: soft, nontender, nondistended, + BS MS: no deformity or atrophy Skin: warm and dry, no rash Neuro:  Strength and sensation are intact Psych: euthymic mood, full affect    Recent Labs: 03/31/2015: BUN 21; Creatinine, Ser 1.07; Potassium 3.9; Sodium 134* 06/08/2015: ALT 18   Lipid Panel    Component Value Date/Time   CHOL 109 11/30/2014 1237   TRIG 91.0 11/30/2014 1237   HDL 25.50* 11/30/2014 1237   CHOLHDL 4 11/30/2014 1237   VLDL 18.2 11/30/2014 1237    LDLCALC 65 11/30/2014 1237     Other studies Reviewed: Additional studies/ records that were reviewed today with results demonstrating: Echo from June 2014 showing severely reduced left ventricular function and severe mitral regurgitation..   ASSESSMENT AND PLAN:  1. Cardiomyopathy: His recent flare of heart failure has resolved. We again had a long discussion about the natural course of his illness. I explained him that his cardiac function will likely continue to get worse. There are more advanced therapies available such as biventricular pacing and defibrillator. He has declined AICD placement in the past. He continues to not be interested. He states that if he feels worse, he will let us know. I explained him that he may progress to the point where he may need inotropes or ventricular assist device. He states that he would be hesitant to follow any type of very invasive strategy. He would consider it at the time it was required. I did explain to him that some of these therapies may be better considered when he is healthier as opposed to when he gets too sick. He continues to not be interested in any further evaluation. We discussed repeat echocardiogram to see if something is change with his cardiac function given the recent heart failure flareup. He is not interested in even a repeat echocardiogram. 2. PVC: He continues to have PVCs. He is not bothered by them. 3. Severe mitral regurgitation: This likely functional due to left ventricular chamber dilatation. 4. Anticoagulant monitoring: Continue to target INR between 2 and 3. 5. Hyperlipidemia: Lipids controlled in January 2016.   Current medicines are reviewed at length with the patient today.  The patient concerns regarding his medicines were addressed.  The following changes have been made:  No change  Labs/ tests ordered today include:  No orders of the defined types were placed in this encounter.    Recommend 150 minutes/week of  aerobic exercise Low fat, low carb, high fiber diet recommended  Disposition:   FU in 6 months   Delorise Jackson., MD  09/03/2015 10:35 AM    [facility] [address] [place], [place], [place]  [address] Phone: [phone]; Fax: [phone]

## 2015-09-13 ENCOUNTER — Telehealth: Payer: Self-pay | Admitting: Interventional Cardiology

## 2015-09-13 NOTE — Telephone Encounter (Signed)
New message     Pt has gout.  His PCP want to presc colchicine. Is it ok with Dr Eldridge Dace to take this medication along with his heart meds?

## 2015-09-13 NOTE — Telephone Encounter (Signed)
Please check with Pharmacist.  If ok with them, then can give the patient the go ahead to start colchicine.

## 2015-09-13 NOTE — Telephone Encounter (Signed)
Per Meagan, pharmacist, It is ok for the pt to take Colchicine to treat his gout. Dr Eldridge Dace is aware.

## 2015-09-13 NOTE — Telephone Encounter (Signed)
Please advise 

## 2015-09-14 ENCOUNTER — Ambulatory Visit (INDEPENDENT_AMBULATORY_CARE_PROVIDER_SITE_OTHER): Payer: Medicare Other | Admitting: *Deleted

## 2015-09-14 DIAGNOSIS — I749 Embolism and thrombosis of unspecified artery: Secondary | ICD-10-CM

## 2015-09-14 LAB — POCT INR: INR: 2.2

## 2015-10-05 ENCOUNTER — Telehealth: Payer: Self-pay | Admitting: Interventional Cardiology

## 2015-10-05 NOTE — Telephone Encounter (Signed)
**Note De-Identified Naida Escalante Obfuscation** Per Dr Katrinka Blazing (DOD) the pt is advised to take 80 mg of Torsemide now and 80 mg tomorrow morning and to call the office tomorrow to let us know how he is. If the increased dose of Torsemide is not effective and the pt continues to have swelling pt needs to be added to flex schedule per Dr Katrinka Blazing. The pt repeated instructions back to me and verbalized understanding.

## 2015-10-05 NOTE — Telephone Encounter (Signed)
**Note De-Identified Aveah Castell Obfuscation** The pt states that he has had swelling in his legs for about a week and mild SOB since yesterday. He states that he does not have a scale at home so he is unaware of his weight but feels sure he has gained due to his swelling. The pt takes Torsemide 60 mg daily. He wants to know what to do.  He is advised that I will discuss with our DOD and call him back with recommendations. He verbalized understanding.

## 2015-10-05 NOTE — Telephone Encounter (Signed)
Pt c/o swelling: STAT is pt has developed SOB within 24 hours  1. How long have you been experiencing swelling? 1week  2. Where is the swelling located? Ankles and up legs, thighs  3.  Are you currently taking a "fluid pill"?yes  4.  Are you currently SOB? no  5.  Have you traveled recently?no

## 2015-10-06 NOTE — Telephone Encounter (Signed)
**Note De-Identified [place] Grove Obfuscation** The pt states that he took Torsemide 80 mg last night at 10:30 and 80 mg at 8:00 this morning as advised. He reports that he urinated 4 times throughout the night and that he has urinated 3 times since taking his dose this am. He states that he feels better with no SOB at this time and that his edema is improving. He states that he knows his body and that it will take a couple days for his legs to go back to normal for him. He reports that he is elevating his legs as much as he can and avoiding salt. The pt is advised to purchase a set of scales and to weight daily so we will have a better understanding how much fluid he is retaining.  He states that he will.  He is advised to call us back if his edema/SOB persist or worsens so we can schedule him to be seen as advised per Dr Katrinka Blazing.

## 2015-10-11 ENCOUNTER — Other Ambulatory Visit: Payer: Self-pay | Admitting: Interventional Cardiology

## 2015-10-25 ENCOUNTER — Ambulatory Visit (INDEPENDENT_AMBULATORY_CARE_PROVIDER_SITE_OTHER): Payer: Medicare Other | Admitting: Pharmacist

## 2015-10-25 ENCOUNTER — Telehealth: Payer: Self-pay | Admitting: Interventional Cardiology

## 2015-10-25 DIAGNOSIS — I749 Embolism and thrombosis of unspecified artery: Secondary | ICD-10-CM | POA: Diagnosis not present

## 2015-10-25 LAB — POCT INR: INR: 5.7

## 2015-10-25 NOTE — Telephone Encounter (Signed)
Pt c/o swelling: STAT is pt has developed SOB within 24 hours  1. How long have you been experiencing swelling?  He said for a while no date given he said you are aware  2. Where is the swelling located? Legs and ankles  3.  Are you currently taking a "fluid pill"? Yes  4.  Are you currently SOB? No  5.  Have you traveled recently?no

## 2015-10-25 NOTE — Telephone Encounter (Signed)
The pt states that he has swelling in his feet, legs and up in his thighs. He wants to know if he can have permission to take more Torsemide when he has increased swelling. He is advised that I am going to discuss with our DOD, Dr Delton See, and call him back with her recommendations.

## 2015-10-25 NOTE — Telephone Encounter (Signed)
The pt is advised per Dr Delton See, DOD, to take 40 mg of Torsemide BID X 2 days. He verbalized understanding and agrees with plan.  He is aware that I will speak with Dr Eldridge Dace tomorrow to see if he can give the pt new Torsemide instructions.

## 2015-10-27 MED ORDER — TORSEMIDE 20 MG PO TABS: ORAL_TABLET | ORAL | Status: DC

## 2015-10-27 NOTE — Telephone Encounter (Signed)
**Note De-Identified Matheu Ploeger Obfuscation** LMTCB.  Per Dr Eldridge Dace the pt may increase his Torsemide to 80 mg in the am and 40 mg in the pm. Increased [facility].

## 2015-10-27 NOTE — Telephone Encounter (Signed)
**Note De-identified Charles Shaw Obfuscation** The pt is advised and he verbalized understanding. 

## 2015-10-30 ENCOUNTER — Other Ambulatory Visit: Payer: Self-pay | Admitting: Interventional Cardiology

## 2015-11-01 ENCOUNTER — Other Ambulatory Visit: Payer: Self-pay | Admitting: Interventional Cardiology

## 2015-11-02 ENCOUNTER — Other Ambulatory Visit: Payer: Self-pay | Admitting: Interventional Cardiology

## 2015-11-08 ENCOUNTER — Telehealth: Payer: Self-pay | Admitting: Interventional Cardiology

## 2015-11-08 ENCOUNTER — Other Ambulatory Visit: Payer: Self-pay | Admitting: *Deleted

## 2015-11-08 MED ORDER — TORSEMIDE 20 MG PO TABS: ORAL_TABLET | ORAL | Status: DC

## 2015-11-08 NOTE — Telephone Encounter (Signed)
New message       *STAT* If patient is at the pharmacy, call can be transferred to refill team.   1. Which medications need to be refilled? (please list name of each medication and dose if known) torsemide 20mg  tab  2. Which pharmacy/location (including street and city if local pharmacy) is medication to be sent to? [facility] ---last presc was called in to [facility] pharmacy 3. Do they need a 30 day or 90 day supply? 90 tablets Pt is out of medications

## 2015-11-08 NOTE — Telephone Encounter (Signed)
Spoke with [facility], cancelled the torsemide with them and sent to [facility] per pt request.

## 2015-12-03 ENCOUNTER — Telehealth: Payer: Self-pay | Admitting: Interventional Cardiology

## 2015-12-03 MED ORDER — METOLAZONE 2.5 MG PO TABS: 2.5000 mg | ORAL_TABLET | ORAL | Status: DC

## 2015-12-03 NOTE — Telephone Encounter (Signed)
The pts wife is advised that the pt should continue his Torsemide 80 mg in the am and 40 mg in the pm and to add Metolazone 2.5 mg once weekly. Also, she is asked to call me next week to let me know how the Metolazone worked for the pt. She verbalized understanding.  Will forward message to Dr Eldridge Dace as Lorain Childes.

## 2015-12-03 NOTE — Telephone Encounter (Signed)
**Note De-identified Charles Shaw Obfuscation** Phone is busy. Will continue to call. 

## 2015-12-03 NOTE — Telephone Encounter (Signed)
New message     Patient wife calling wants to talk with nurse about some things going with her husband.  Did not want to disclose any information will discuss with nurse

## 2015-12-03 NOTE — Telephone Encounter (Signed)
Pt c/o medication issue: 1. Name of Medication:   metolazone (ZAROXOLYN) 2.5 MG tablet torsemide (DEMADEX) 20 MG tablet  4. What is your medication issue? Should he take the torsemide (DEMADEX) 20 MG tablet as normal with the metolazone (ZAROXOLYN) 2.5 MG tablet

## 2015-12-03 NOTE — Telephone Encounter (Signed)
The pts wife states that the pt has increased swelling in his legs, ankles and face and that she nor the pt feels that Torsemide is working anymore. She wants to know if there is another medication that the pt can take instead.  Per Dr Eldridge Dace the pts wife is advised that the pt should start taking Metolazone 2.5 mg once weekly and to call me back next week to let us know if his first dose of Metolazone was effective. She verbalized understanding.  [facility]olazone [facility] to [facility] to fill per the pts wife's request.

## 2015-12-03 NOTE — Telephone Encounter (Signed)
Follow up   Wife returning call for rn  About husbands care

## 2015-12-08 ENCOUNTER — Other Ambulatory Visit: Payer: Self-pay | Admitting: Interventional Cardiology

## 2015-12-08 ENCOUNTER — Telehealth: Payer: Self-pay | Admitting: Interventional Cardiology

## 2015-12-08 DIAGNOSIS — M7989 Other specified soft tissue disorders: Secondary | ICD-10-CM

## 2015-12-08 NOTE — Telephone Encounter (Signed)
Wife called about 3:30 to advise [place] that Charles Shaw took the Metolazone 2.5 mg on Fri 1/13.  Had good results with decrease swelling in his face and legs until Mon pm-Tues AM.  States he feels like he is filling up again".  States face is swollen, abd swollen, and legs swollen-worse in (L) leg.  No SOB.  States he has a hard time walking due to swelling.  States he is sleeping a lot-most of day and night.  BP last PM was 90/46 HR 65. She states he does not weigh because their scale was broken but she is going to order one today. Last lab drawn was 03/2015. Spoke w/Dayna Dunn,PA who states he needs to go to ER. Is on maximum dose of fluid pill without checking labs and is at risk of being in CHF.  Wife states she doesn't feel like he will go to ER.  He is in the shower right now and will have him call us back. 4:40 pm he called back.  States he understands but doesn't feel like he will go to ER tonight.  States he feels fine except for the fluid.  Again reiterated that we can't give him additional pills without checking blood work-K+ can be depleted by Metolazone and with the amount of swelling he is having would be safer to go to ER.  He verbalizes understanding but still will wait until tomorrow.  States he will call Larita Fife Via,LPN in AM. Will route to Dr. Eldridge Dace and Larita Fife Via,LPN.

## 2015-12-08 NOTE — Telephone Encounter (Signed)
New message  Pt c/o medication issue: 1. Name of Medication: metolazone (ZAROXOLYN) 2.5 MG tablet   4. What is your medication issue? Still taking torsemide. It works for a day or two and then he fills back up with fluid. Please call back to discuss.

## 2015-12-09 NOTE — Telephone Encounter (Signed)
Spoke with pt's wife, DPR on file. Advised her of Dr. Hoyle Barr order for BMET. Wife spoke with pt and pt was agreeable to come in tomorrow. Wife states that pt also mentioned that he was due for INR check and would like to have both done tomorrow around noon if that was ok. Advised wife that I would speak with [facility] nurse and call her back with appt. Spoke with Jasmine December in [facility] and she scheduled pt for 12Noon tomorrow. Wife called back before I could call and said she will call back within an hour. Will await call back. Lab ordered and scheduled for tomorrow along with [facility] appt.

## 2015-12-09 NOTE — Telephone Encounter (Signed)
He needs a BMet today or tomorrow.  He should take an extra KCl 20 mEq (40 mEq total) when he takes a metolazone 2.5 mg tab.   Corky Crafts, MD

## 2015-12-09 NOTE — Telephone Encounter (Signed)
Follow up   Patient wife calling back stating she will call the nurse back within an hours due to another appt

## 2015-12-09 NOTE — Telephone Encounter (Signed)
Spoke with pt and he informed him of appts for tomorrow.  Pt asked if he could come a little later tomorrow for [facility] appt. Changed pt's appt to 2:15pm. Pt verbalized understanding and is agreement to come in for lab work and [facility] appt.

## 2015-12-10 ENCOUNTER — Other Ambulatory Visit (INDEPENDENT_AMBULATORY_CARE_PROVIDER_SITE_OTHER): Payer: Medicare Other | Admitting: *Deleted

## 2015-12-10 ENCOUNTER — Ambulatory Visit (INDEPENDENT_AMBULATORY_CARE_PROVIDER_SITE_OTHER): Payer: Medicare Other | Admitting: *Deleted

## 2015-12-10 DIAGNOSIS — I749 Embolism and thrombosis of unspecified artery: Secondary | ICD-10-CM | POA: Diagnosis not present

## 2015-12-10 DIAGNOSIS — M7989 Other specified soft tissue disorders: Secondary | ICD-10-CM

## 2015-12-10 LAB — POCT INR: INR: 3.8

## 2015-12-11 LAB — BASIC METABOLIC PANEL
BUN: 27 mg/dL — AB (ref 7–25)
CHLORIDE: 88 mmol/L — AB (ref 98–110)
CO2: 31 mmol/L (ref 20–31)
CREATININE: 1.01 mg/dL (ref 0.70–1.18)
Calcium: 8.9 mg/dL (ref 8.6–10.3)
Glucose, Bld: 97 mg/dL (ref 65–99)
Potassium: 4.1 mmol/L (ref 3.5–5.3)
Sodium: 126 mmol/L — ABNORMAL LOW (ref 135–146)

## 2015-12-13 ENCOUNTER — Other Ambulatory Visit: Payer: Self-pay | Admitting: *Deleted

## 2015-12-13 DIAGNOSIS — E871 Hypo-osmolality and hyponatremia: Secondary | ICD-10-CM

## 2015-12-21 ENCOUNTER — Other Ambulatory Visit (INDEPENDENT_AMBULATORY_CARE_PROVIDER_SITE_OTHER): Payer: Medicare Other

## 2015-12-21 ENCOUNTER — Ambulatory Visit (INDEPENDENT_AMBULATORY_CARE_PROVIDER_SITE_OTHER): Payer: Medicare Other | Admitting: *Deleted

## 2015-12-21 DIAGNOSIS — E871 Hypo-osmolality and hyponatremia: Secondary | ICD-10-CM | POA: Diagnosis not present

## 2015-12-21 DIAGNOSIS — I749 Embolism and thrombosis of unspecified artery: Secondary | ICD-10-CM

## 2015-12-21 LAB — BASIC METABOLIC PANEL
BUN: 32 mg/dL — ABNORMAL HIGH (ref 7–25)
CALCIUM: 9.4 mg/dL (ref 8.6–10.3)
CO2: 31 mmol/L (ref 20–31)
Chloride: 96 mmol/L — ABNORMAL LOW (ref 98–110)
Creat: 0.92 mg/dL (ref 0.70–1.18)
Glucose, Bld: 83 mg/dL (ref 65–99)
Potassium: 4.7 mmol/L (ref 3.5–5.3)
SODIUM: 136 mmol/L (ref 135–146)

## 2015-12-21 LAB — POCT INR: INR: 2.5

## 2015-12-29 ENCOUNTER — Other Ambulatory Visit: Payer: Self-pay

## 2015-12-29 DIAGNOSIS — I420 Dilated cardiomyopathy: Secondary | ICD-10-CM

## 2015-12-29 DIAGNOSIS — I509 Heart failure, unspecified: Secondary | ICD-10-CM

## 2016-01-07 ENCOUNTER — Other Ambulatory Visit: Payer: Self-pay | Admitting: Interventional Cardiology

## 2016-01-07 MED ORDER — WARFARIN SODIUM 2.5 MG PO TABS: 2.5000 mg | ORAL_TABLET | ORAL | Status: DC

## 2016-01-10 ENCOUNTER — Other Ambulatory Visit: Payer: Self-pay | Admitting: Interventional Cardiology

## 2016-01-11 ENCOUNTER — Ambulatory Visit (INDEPENDENT_AMBULATORY_CARE_PROVIDER_SITE_OTHER): Payer: Medicare Other | Admitting: *Deleted

## 2016-01-11 DIAGNOSIS — I749 Embolism and thrombosis of unspecified artery: Secondary | ICD-10-CM | POA: Diagnosis not present

## 2016-01-11 LAB — POCT INR: INR: 2.4

## 2016-01-25 ENCOUNTER — Telehealth: Payer: Self-pay | Admitting: Interventional Cardiology

## 2016-01-25 NOTE — Telephone Encounter (Signed)
New Message  Pt requested to speak w/ RN concerning lab work he has sched for tomorrow 01/26/16. Please call back and discuss.

## 2016-01-25 NOTE — Telephone Encounter (Signed)
**Note De-Identified Oluwakemi Salsberry Obfuscation** The pt is requesting to change his lab apt from tomorrow 01/26/16 to Thursday 01/27/16. He is advised that he may come in anytime Thursday that is convenient for him. He verbalized understanding and thanked me for my assistance.

## 2016-01-26 ENCOUNTER — Other Ambulatory Visit: Payer: Medicare Other

## 2016-01-27 ENCOUNTER — Other Ambulatory Visit (INDEPENDENT_AMBULATORY_CARE_PROVIDER_SITE_OTHER): Payer: Medicare Other | Admitting: *Deleted

## 2016-01-27 DIAGNOSIS — I509 Heart failure, unspecified: Secondary | ICD-10-CM

## 2016-01-27 DIAGNOSIS — I420 Dilated cardiomyopathy: Secondary | ICD-10-CM

## 2016-01-27 LAB — BASIC METABOLIC PANEL
BUN: 22 mg/dL (ref 7–25)
CHLORIDE: 96 mmol/L — AB (ref 98–110)
CO2: 27 mmol/L (ref 20–31)
Calcium: 8.8 mg/dL (ref 8.6–10.3)
Creat: 0.96 mg/dL (ref 0.70–1.18)
GLUCOSE: 77 mg/dL (ref 65–99)
POTASSIUM: 4 mmol/L (ref 3.5–5.3)
SODIUM: 133 mmol/L — AB (ref 135–146)

## 2016-02-10 ENCOUNTER — Ambulatory Visit (INDEPENDENT_AMBULATORY_CARE_PROVIDER_SITE_OTHER): Payer: Medicare Other | Admitting: *Deleted

## 2016-02-10 DIAGNOSIS — I749 Embolism and thrombosis of unspecified artery: Secondary | ICD-10-CM | POA: Diagnosis not present

## 2016-02-10 LAB — POCT INR: INR: 3.1

## 2016-03-02 ENCOUNTER — Ambulatory Visit (INDEPENDENT_AMBULATORY_CARE_PROVIDER_SITE_OTHER): Payer: Medicare Other | Admitting: *Deleted

## 2016-03-02 DIAGNOSIS — I749 Embolism and thrombosis of unspecified artery: Secondary | ICD-10-CM | POA: Diagnosis not present

## 2016-03-02 LAB — POCT INR: INR: 2.6

## 2016-03-14 ENCOUNTER — Telehealth: Payer: Self-pay | Admitting: Interventional Cardiology

## 2016-03-14 NOTE — Telephone Encounter (Signed)
The pt has been scheduled to see Dr Eldridge Dace on 7/18 at 3:45. He agrees with date and time of apt.

## 2016-03-14 NOTE — Telephone Encounter (Signed)
New Message  Pt requested to only speak w/ RN- concerning his follow up. Please call back and discuss.

## 2016-03-30 ENCOUNTER — Ambulatory Visit (INDEPENDENT_AMBULATORY_CARE_PROVIDER_SITE_OTHER): Payer: Medicare Other | Admitting: *Deleted

## 2016-03-30 DIAGNOSIS — I749 Embolism and thrombosis of unspecified artery: Secondary | ICD-10-CM | POA: Diagnosis not present

## 2016-03-30 LAB — POCT INR: INR: 1.7

## 2016-04-07 ENCOUNTER — Other Ambulatory Visit: Payer: Self-pay | Admitting: Interventional Cardiology

## 2016-04-20 ENCOUNTER — Ambulatory Visit (INDEPENDENT_AMBULATORY_CARE_PROVIDER_SITE_OTHER): Payer: Medicare Other | Admitting: *Deleted

## 2016-04-20 DIAGNOSIS — I749 Embolism and thrombosis of unspecified artery: Secondary | ICD-10-CM | POA: Diagnosis not present

## 2016-04-20 LAB — POCT INR: INR: 2

## 2016-04-23 ENCOUNTER — Other Ambulatory Visit: Payer: Self-pay | Admitting: Interventional Cardiology

## 2016-05-16 ENCOUNTER — Telehealth: Payer: Self-pay | Admitting: Interventional Cardiology

## 2016-05-16 NOTE — Telephone Encounter (Signed)
Spoke with patient and he has been having issues with low blood pressures In am prior to morning medications his systolic blood pressure is in the 90's  Blood pressures throughout the day have been running as low as 60's/40's, with very few systolic readings above 100 Patient states when systolic blood pressure gets down to 60's he gets dizzy and has blurred vision Current blood pressure 94/51 HR 97 Spoke with Dr Jens Som DOD and will have patient decrease his Carvedilol to 6.25 mg twice and Lisinopril 2.5 mg daily.  He takes his Torsemide 20 mg 4 tablets in the evening, will have him only take 2 tonight per Dr Jens Som Scheduled appointment soon  Will forward to Dr Eldridge Dace for review

## 2016-05-16 NOTE — Telephone Encounter (Signed)
New Message:   Pt says when he takes his normal dosage of Coreg,his Blood Pressure drops.Today he took two and his BP 69/42 and 64/40. If he takes one a day it goes up to 90/49 and 70/44 and 86/51.Please call today to advise.

## 2016-05-17 ENCOUNTER — Telehealth: Payer: Self-pay | Admitting: Interventional Cardiology

## 2016-05-17 NOTE — Telephone Encounter (Signed)
The pt is advised per Audrie Lia, Pharm D not to take his afternoon dose of Coreg or Torsemide doses tonight and that he is scheduled to see Kennon Rounds tomorrow at 2:00 after his Coumadin check. Also, I asked the pt to check his BP in the morning before he takes his medications and that if his BP is as low as the readings he has reported to [place] not to take his BP meds. The pt verbalized understanding and is in agreement with the plan.

## 2016-05-17 NOTE — Telephone Encounter (Signed)
**Note De-Identified Jalayla Chrismer Obfuscation** The pts wife is advised that a scheduler from this office will be calling the pt to arrange date and time of [facility] Clinic appt.

## 2016-05-17 NOTE — Telephone Encounter (Signed)
Pt returned call to Lynn.  

## 2016-05-17 NOTE — Telephone Encounter (Signed)
-   As below

## 2016-05-17 NOTE — Telephone Encounter (Signed)
F/u  Pt wanted to also mention- tthis morning 6/28 his BP- 91/54 p 100 and after a half tab of coreg this afternoon 6/28- his BP 78/40 p 90. Can leave detailed message on vm. Please call back and discuss.

## 2016-05-17 NOTE — Telephone Encounter (Signed)
OK to schedule with PharmD HTN clinic.

## 2016-05-18 ENCOUNTER — Ambulatory Visit (INDEPENDENT_AMBULATORY_CARE_PROVIDER_SITE_OTHER): Payer: Medicare Other | Admitting: Pharmacist

## 2016-05-18 VITALS — BP 100/58 | HR 96

## 2016-05-18 DIAGNOSIS — I952 Hypotension due to drugs: Secondary | ICD-10-CM | POA: Diagnosis not present

## 2016-05-18 DIAGNOSIS — I749 Embolism and thrombosis of unspecified artery: Secondary | ICD-10-CM | POA: Diagnosis not present

## 2016-05-18 LAB — POCT INR: INR: 2

## 2016-05-18 MED ORDER — CARVEDILOL 3.125 MG PO TABS: 3.1250 mg | ORAL_TABLET | Freq: Two times a day (BID) | ORAL | Status: AC

## 2016-05-18 NOTE — Progress Notes (Signed)
Patient ID: Charles Shaw                 DOB: [birth year]                      MRN: [id number]     HPI: Charles Shaw is a 75 y.o. male referred by Dr. Eldridge Dace to the HTN clinic after having low BP readings this past week.  PMH is significant for CHF and HLD.  He called on 6/27 to report when he took his normal dose of Coreg, his BP dropped to 69/42 adn 64/40.  If he only took the tablet once a day,his BP would be normal for him around 90 systolic.  He states he would get dizzy and have blurred vision with the lower BP.  We asked him to cut back his coreg to 1/2 tablet BID and 1/2 his lisinopril.  His BP was still low so he is here today for follow up.   Pt states he is not symptomatic with BPs in the 90s- this is normal for him.  He has not taken any of his medication for today.   Current HTN meds: carvedilol 6.25mg  BID, lisinopril 2.5mg  daily, torsemide 60mg  daily  BP goal: <140/90   Wt Readings from Last 3 Encounters:  09/03/15 146 lb (66.225 kg)  11/30/14 150 lb (68.04 kg)  04/14/14 147 lb (66.679 kg)   BP Readings from Last 3 Encounters:  05/18/16 100/58  09/03/15 108/58  11/30/14 90/60   Pulse Readings from Last 3 Encounters:  05/18/16 96  09/03/15 92  11/30/14 71    Renal function: CrCl cannot be calculated (Unknown ideal weight.).  Past Medical History  Diagnosis Date  . Hyperlipidemia   . Anxiety   . Heart failure (HCC)   . PVC (premature ventricular contraction)   . Thrombus     Current Outpatient Prescriptions on File Prior to Visit  Medication Sig Dispense Refill  . Potassium Chloride ER 20 MEQ TBCR Take 1 tablet by mouth daily. 60 tablet 6  . potassium chloride SA (K-DUR,KLOR-CON) 20 MEQ tablet Take 20 mEq by mouth daily.    . simvastatin (ZOCOR) 10 MG tablet TAKE ONE TABLET BY MOUTH DAILY IN THE EVENING 90 tablet 2  . torsemide (DEMADEX) 20 MG tablet Take 60 mg by mouth daily.    Marland Kitchen warfarin (COUMADIN) 2.5 MG tablet TAKE AS DIRECTED BY COUMADIN CLINIC 45  tablet 1   No current facility-administered medications on file prior to visit.    No Known Allergies   Assessment/Plan:  1.  Hypotension- Pt's BP okay in clinic today but he has not had any medications other than Coumadin all day.  His HR has been elevated so would like to try to keep him on carvedilol.  Will decrease dose to 3.125mg  BID.  If BP still continues to be low or pt has symptoms, have asked him to call back.  Would likely need to decrease torsemide dose of stop lisinopril.  He has a follow up with Dr. Eldridge Dace in 3 weeks.

## 2016-05-22 ENCOUNTER — Other Ambulatory Visit: Payer: Self-pay | Admitting: *Deleted

## 2016-05-22 MED ORDER — POTASSIUM CHLORIDE CRYS ER 20 MEQ PO TBCR: 20.0000 meq | EXTENDED_RELEASE_TABLET | Freq: Every day | ORAL | Status: AC

## 2016-05-24 ENCOUNTER — Telehealth: Payer: Self-pay | Admitting: Interventional Cardiology

## 2016-05-24 NOTE — Telephone Encounter (Signed)
Follow up   Pt wife verbalized that she needs Dr.Varanasi to sign off on the death certificate.  Pt wife verbalized that she had additional questions for Dr.Varanasi

## 2016-05-24 NOTE — Telephone Encounter (Signed)
Dr.Varanasi signed and completed Death Certificate for this patient. Called [employer] [phone])  they are  Aware ready for pick up. Faxed copy to [facility] per their request to [phone]

## 2016-05-24 NOTE — Telephone Encounter (Signed)
Death certificate has been completed and signed by Dr Eldridge Dace.  It was taken to MR to scan and call funeral home for pick up.

## 2016-05-24 NOTE — Telephone Encounter (Signed)
Pt's wife states that pt passed away at home in bed on 03-26-23 2016/05/29. Pt's wife states that EMS came to the home 2023/03/26. Pt's wife asking for Dr Eldridge Dace to sign death certificate as soon as possible so she can make funeral arrangements. Pt's wife asking if Dr Eldridge Dace unable to sign death certificate today if he could designate someone to sign death certificate.  Pt's wife advised I will forward to Dr Eldridge Dace for review.

## 2016-05-24 NOTE — Telephone Encounter (Signed)
Death Certificate received From gate [facility] on this patient-Dr.Varanasi back Friday  He will sign then. Funeral home aware.

## 2016-05-24 NOTE — Telephone Encounter (Signed)
Wife left VM on medical records phone-I called her back let her know we were working on getting d/c  Signed. Once completed I will call Funeral home back and let them know so they can come and pick up.

## 2016-05-24 NOTE — Telephone Encounter (Signed)
Pt's wife calling wanting a call back from Dr. Eldridge Dace, explained he was at the hospital, then asked for Larita Fife, explained she was off today, I told her I can send message to Triage nurse, she then again said she needed to speak to Dr. Eldridge Dace, I told her the Triage nurse can call her and get in touch with Dr. Eldridge Dace if needed-she okayed this-when asked what the call was regarding she wouldn't say, just that it was regarding CHF and it's an emergency, I explained the more info I can give the better the nurse is prepared to call her back, and with so many calls coming in the more urgent calls are called back first and without knowing what the call is regarding the nurse won't know how urgent it is-she states it's urgent and just have them call me asap.

## 2016-06-06 ENCOUNTER — Ambulatory Visit: Payer: Medicare Other | Admitting: Interventional Cardiology

## 2016-06-20 DEATH — deceased
